# Patient Record
Sex: Female | Born: 1970 | Race: White | Hispanic: No | Marital: Married | Smoking: Never smoker
Health system: Southern US, Community
[De-identification: ages and names within clinical notes are randomized; demographics above are authoritative.]

## PROBLEM LIST (undated history)

## (undated) DIAGNOSIS — M1990 Unspecified osteoarthritis, unspecified site: Secondary | ICD-10-CM

## (undated) DIAGNOSIS — D259 Leiomyoma of uterus, unspecified: Secondary | ICD-10-CM

## (undated) DIAGNOSIS — Z8489 Family history of other specified conditions: Secondary | ICD-10-CM

## (undated) DIAGNOSIS — F419 Anxiety disorder, unspecified: Secondary | ICD-10-CM

## (undated) DIAGNOSIS — R600 Localized edema: Secondary | ICD-10-CM

## (undated) HISTORY — PX: MEDIAL COLLATERAL LIGAMENT AND LATERAL COLLATERAL LIGAMENT REPAIR, ELBOW: SHX2016

## (undated) HISTORY — PX: TONSILLECTOMY: SUR1361

## (undated) HISTORY — PX: BREAST SURGERY: SHX581

## (undated) HISTORY — PX: EYE SURGERY: SHX253

## (undated) HISTORY — PX: BREAST ENHANCEMENT SURGERY: SHX7

## (undated) HISTORY — PX: ABDOMINOPLASTY: SUR9

## (undated) HISTORY — PX: FRACTURE SURGERY: SHX138

## (undated) HISTORY — PX: ANTERIOR CRUCIATE LIGAMENT REPAIR: SHX115

## (undated) HISTORY — PX: COSMETIC SURGERY: SHX468

---

## 2011-04-26 HISTORY — PX: ANTERIOR CRUCIATE LIGAMENT REPAIR: SHX115

## 2012-02-07 DIAGNOSIS — S83289A Other tear of lateral meniscus, current injury, unspecified knee, initial encounter: Secondary | ICD-10-CM | POA: Insufficient documentation

## 2012-02-07 HISTORY — DX: Other tear of lateral meniscus, current injury, unspecified knee, initial encounter: S83.289A

## 2017-07-31 DIAGNOSIS — H532 Diplopia: Secondary | ICD-10-CM | POA: Insufficient documentation

## 2017-07-31 DIAGNOSIS — R29891 Ocular torticollis: Secondary | ICD-10-CM | POA: Insufficient documentation

## 2017-07-31 DIAGNOSIS — H4910 Fourth [trochlear] nerve palsy, unspecified eye: Secondary | ICD-10-CM | POA: Insufficient documentation

## 2017-07-31 DIAGNOSIS — H5022 Vertical strabismus, left eye: Secondary | ICD-10-CM

## 2017-07-31 DIAGNOSIS — H5021 Vertical strabismus, right eye: Secondary | ICD-10-CM | POA: Insufficient documentation

## 2017-07-31 DIAGNOSIS — H519 Unspecified disorder of binocular movement: Secondary | ICD-10-CM

## 2017-07-31 HISTORY — DX: Vertical strabismus, left eye: H50.22

## 2017-07-31 HISTORY — DX: Unspecified disorder of binocular movement: H51.9

## 2017-10-19 DIAGNOSIS — Z9889 Other specified postprocedural states: Secondary | ICD-10-CM | POA: Insufficient documentation

## 2017-10-19 HISTORY — DX: Other specified postprocedural states: Z98.890

## 2018-03-13 LAB — HM MAMMOGRAPHY

## 2019-12-23 LAB — RESULTS CONSOLE HPV: CHL HPV: NEGATIVE

## 2019-12-23 LAB — HM PAP SMEAR
HM Pap smear: NEGATIVE
HPV, high-risk: NEGATIVE

## 2020-02-06 ENCOUNTER — Ambulatory Visit (INDEPENDENT_AMBULATORY_CARE_PROVIDER_SITE_OTHER): Payer: Self-pay | Admitting: Family Medicine

## 2020-02-20 ENCOUNTER — Ambulatory Visit (INDEPENDENT_AMBULATORY_CARE_PROVIDER_SITE_OTHER): Payer: Self-pay | Admitting: Family Medicine

## 2020-09-22 ENCOUNTER — Ambulatory Visit
Admission: RE | Admit: 2020-09-22 | Discharge: 2020-09-22 | Disposition: A | Discharge status: Home / Self Care | Payer: Federal, State, Local not specified - PPO | Source: Ambulatory Visit | Attending: Family Medicine | Admitting: Family Medicine

## 2020-09-22 ENCOUNTER — Other Ambulatory Visit: Payer: Self-pay

## 2020-09-22 VITALS — BP 134/88 | HR 96 | Temp 98.3°F | Resp 18 | Ht 63.0 in | Wt 250.0 lb

## 2020-09-22 DIAGNOSIS — R197 Diarrhea, unspecified: Secondary | ICD-10-CM | POA: Insufficient documentation

## 2020-09-22 HISTORY — DX: Anxiety disorder, unspecified: F41.9

## 2020-09-22 MED ORDER — DICYCLOMINE HCL 20 MG PO TABS: 20.0000 mg | ORAL_TABLET | Freq: Two times a day (BID) | ORAL | 0 refills | Status: DC

## 2020-09-22 MED ORDER — DIPHENOXYLATE-ATROPINE 2.5-0.025 MG PO TABS: 1.0000 | ORAL_TABLET | Freq: Four times a day (QID) | ORAL | 0 refills | Status: DC | PRN

## 2020-09-22 NOTE — ED Provider Notes (Signed)
[facility]    CSN: [id number] Arrival date & time: 09/22/20  1445      History   Chief Complaint Chief Complaint  Patient presents with  . Diarrhea    HPI Kimberly Good is a 50 y.o. female.   Reports that she has been having watery diarrhea, at least 5-6 episodes a day for the last 6 days. Has been taking imodium without relief. Reports that she has recently been camping but that she only drinks bottled water. Also reports that she has recently been on two separate antibiotics for a sinus infection and a UTI. Denies previous symptoms. Denies headache, cough, nausea, vomiting, rash, fever, other symptoms.  ROS per HPI  The history is provided by the patient.    Past Medical History:  Diagnosis Date  . Anxiety     There are no problems to display for this patient.   Past Surgical History:  Procedure Laterality Date  . ANTERIOR CRUCIATE LIGAMENT REPAIR    . BREAST ENHANCEMENT SURGERY    . COSMETIC SURGERY     arm lift   . MEDIAL COLLATERAL LIGAMENT AND LATERAL COLLATERAL LIGAMENT REPAIR, ELBOW    . TONSILLECTOMY      OB History   No obstetric history on file.      Home Medications    Prior to Admission medications   Medication Sig Start Date End Date Taking? Authorizing Provider  dicyclomine (BENTYL) 20 MG tablet Take 1 tablet (20 mg total) by mouth 2 (two) times daily. 09/22/20  Yes Moshe Cipro, NP  diphenoxylate-atropine (LOMOTIL) 2.5-0.025 MG tablet Take 1 tablet by mouth 4 (four) times daily as needed for diarrhea or loose stools. 09/22/20  Yes Moshe Cipro, NP  ciprofloxacin (CIPRO) 500 MG tablet Take 1 tablet (500 mg total) by mouth 2 (two) times daily. 09/23/20   Moshe Cipro, NP    Family History No family history on file.  Social History Social History   Tobacco Use  . Smoking status: Never Smoker  . Smokeless tobacco: Never Used  Substance Use Topics  . Alcohol use: Never  . Drug use: Never      Allergies   Patient has no known allergies.   Review of Systems Review of Systems   Physical Exam Triage Vital Signs ED Triage Vitals  Enc Vitals Group     BP 09/22/20 1532 134/88     Pulse Rate 09/22/20 1532 96     Resp 09/22/20 1532 18     Temp 09/22/20 1532 98.3 F (36.8 C)     Temp Source 09/22/20 1532 Oral     SpO2 09/22/20 1532 97 %     Weight --      Height --      Head Circumference --      Peak Flow --      Pain Score 09/22/20 1541 0     Pain Loc --      Pain Edu? --      Excl. in GC? --    No data found.  Updated Vital Signs BP 134/88   Pulse 96   Temp 98.3 F (36.8 C) (Oral)   Resp 18   Ht 5\' 3"  (1.6 m)   Wt 250 lb (113.4 kg)   LMP 06/17/2020   SpO2 97%   BMI 44.29 kg/m   Visual Acuity Right Eye Distance:   Left Eye Distance:   Bilateral Distance:    Right Eye Near:   Left Eye Near:  Bilateral Near:     Physical Exam Vitals and nursing note reviewed.  Constitutional:      General: She is not in acute distress.    Appearance: Normal appearance. She is well-developed. She is ill-appearing.  HENT:     Head: Normocephalic and atraumatic.     Nose: Nose normal.     Mouth/Throat:     Mouth: Mucous membranes are moist.     Pharynx: Oropharynx is clear.  Eyes:     Extraocular Movements: Extraocular movements intact.     Conjunctiva/sclera: Conjunctivae normal.     Pupils: Pupils are equal, round, and reactive to light.  Cardiovascular:     Rate and Rhythm: Normal rate and regular rhythm.     Heart sounds: Normal heart sounds. No murmur heard.   Pulmonary:     Effort: Pulmonary effort is normal. No respiratory distress.     Breath sounds: Normal breath sounds. No stridor. No wheezing, rhonchi or rales.  Abdominal:     General: There is no distension.     Palpations: Abdomen is soft. There is no mass.     Tenderness: There is abdominal tenderness (generalized). There is no right CVA tenderness, left CVA tenderness, guarding or  rebound.     Hernia: No hernia is present.     Comments: Hyperactive bowel sounds  Musculoskeletal:        General: Normal range of motion.     Cervical back: Normal range of motion and neck supple.  Skin:    General: Skin is warm and dry.     Capillary Refill: Capillary refill takes less than 2 seconds.  Neurological:     General: No focal deficit present.     Mental Status: She is alert and oriented to person, place, and time.  Psychiatric:        Mood and Affect: Mood normal.        Behavior: Behavior normal.        Thought Content: Thought content normal.      UC Treatments / Results  Labs (all labs ordered are listed, but only abnormal results are displayed) Labs Reviewed  GASTROINTESTINAL PANEL BY PCR, STOOL (REPLACES STOOL CULTURE) - Abnormal; Notable for the following components:      Result Value   Enteropathogenic E coli (EPEC) DETECTED (*)    All other components within normal limits  C DIFFICILE QUICK SCREEN W PCR REFLEX - Abnormal; Notable for the following components:   C Diff antigen POSITIVE (*)    All other components within normal limits  CLOSTRIDIUM DIFFICILE BY PCR, REFLEXED    EKG   Radiology No results found.  Procedures Procedures (including critical care time)  Medications Ordered in UC Medications - No data to display  Initial Impression / Assessment and Plan / UC Course  I have reviewed the triage vital signs and the nursing notes.  Pertinent labs & imaging results that were available during my care of the patient were reviewed by me and considered in my medical decision making (see chart for details).    Presumed infectious diarrhea  Prescribed Lomotil prn diarrhea Prescribed dicyclomine for abdominal cramping Stool collected in office for GI pathogen panel and Cdiff testing Push fluids and get plenty of rest We will follow up with abnormal results that require further treatment Follow up with this office or with primary care if  symptoms are persisting.  Follow up in the ER for high fever, trouble swallowing, trouble breathing, other concerning symptoms.  Final Clinical Impressions(s) / UC Diagnoses   Final diagnoses:  Diarrhea of presumed infectious origin     Discharge Instructions     GI Pathogen panel ordered  Suspect C diff due to recent antibiotic use  I have sent in lomotil for you to take every 6 hours   I have sent in dicyclomine for you to take twice a day as needed for abdominal cramping  Follow up with the ER if you stop urinating, fever, increased fatigue, other concerning symptoms     ED Prescriptions    Medication Sig Dispense Auth. Provider   diphenoxylate-atropine (LOMOTIL) 2.5-0.025 MG tablet Take 1 tablet by mouth 4 (four) times daily as needed for diarrhea or loose stools. 30 tablet Moshe Cipro, NP   dicyclomine (BENTYL) 20 MG tablet Take 1 tablet (20 mg total) by mouth 2 (two) times daily. 20 tablet Moshe Cipro, NP     PDMP not reviewed this encounter.   Moshe Cipro, NP 09/25/20 1649

## 2020-09-22 NOTE — Discharge Instructions (Addendum)
GI Pathogen panel ordered  Suspect C diff due to recent antibiotic use  I have sent in lomotil for you to take every 6 hours   I have sent in dicyclomine for you to take twice a day as needed for abdominal cramping  Follow up with the ER if you stop urinating, fever, increased fatigue, other concerning symptoms

## 2020-09-22 NOTE — ED Triage Notes (Signed)
Diarrhea x 6 days.  Was told by pcp to come to urgent care for stool study.  Pt has been on 2 abx in the past month.

## 2020-09-23 ENCOUNTER — Telehealth: Payer: Self-pay | Admitting: Family Medicine

## 2020-09-23 DIAGNOSIS — A040 Enteropathogenic Escherichia coli infection: Secondary | ICD-10-CM

## 2020-09-23 LAB — GASTROINTESTINAL PANEL BY PCR, STOOL (REPLACES STOOL CULTURE)

## 2020-09-23 LAB — C DIFFICILE QUICK SCREEN W PCR REFLEX
C Diff antigen: POSITIVE — AB
C Diff toxin: NEGATIVE

## 2020-09-23 MED ORDER — CIPROFLOXACIN HCL 500 MG PO TABS: 500.0000 mg | ORAL_TABLET | Freq: Two times a day (BID) | ORAL | 0 refills | Status: DC

## 2020-09-23 NOTE — Telephone Encounter (Signed)
Sent in Cipro 500mg  BID x 7 days for enteropathogenic E Coli on stool pcr.

## 2020-09-26 ENCOUNTER — Encounter (HOSPITAL_COMMUNITY): Payer: Self-pay | Admitting: Emergency Medicine

## 2020-09-26 ENCOUNTER — Emergency Department (HOSPITAL_COMMUNITY)
Admission: EM | Admit: 2020-09-26 | Discharge: 2020-09-27 | Disposition: A | Discharge status: Home / Self Care | Payer: Federal, State, Local not specified - PPO | Attending: Emergency Medicine | Admitting: Emergency Medicine

## 2020-09-26 ENCOUNTER — Emergency Department (HOSPITAL_COMMUNITY): Payer: Federal, State, Local not specified - PPO

## 2020-09-26 ENCOUNTER — Other Ambulatory Visit: Payer: Self-pay

## 2020-09-26 DIAGNOSIS — M79643 Pain in unspecified hand: Secondary | ICD-10-CM | POA: Insufficient documentation

## 2020-09-26 DIAGNOSIS — R079 Chest pain, unspecified: Secondary | ICD-10-CM | POA: Insufficient documentation

## 2020-09-26 DIAGNOSIS — T360X5A Adverse effect of penicillins, initial encounter: Secondary | ICD-10-CM | POA: Insufficient documentation

## 2020-09-26 DIAGNOSIS — R197 Diarrhea, unspecified: Secondary | ICD-10-CM | POA: Diagnosis not present

## 2020-09-26 DIAGNOSIS — X58XXXA Exposure to other specified factors, initial encounter: Secondary | ICD-10-CM | POA: Insufficient documentation

## 2020-09-26 DIAGNOSIS — T887XXA Unspecified adverse effect of drug or medicament, initial encounter: Secondary | ICD-10-CM

## 2020-09-26 LAB — CBC
HCT: 38.2 % (ref 36.0–46.0)
Hemoglobin: 12.9 g/dL (ref 12.0–15.0)
MCH: 28.1 pg (ref 26.0–34.0)
MCHC: 33.8 g/dL (ref 30.0–36.0)
MCV: 83.2 fL (ref 80.0–100.0)
Platelets: 286 10*3/uL (ref 150–400)
RBC: 4.59 MIL/uL (ref 3.87–5.11)
RDW: 14.7 % (ref 11.5–15.5)
WBC: 6.7 10*3/uL (ref 4.0–10.5)
nRBC: 0 % (ref 0.0–0.2)

## 2020-09-26 LAB — BASIC METABOLIC PANEL
Anion gap: 7 (ref 5–15)
BUN: 7 mg/dL (ref 6–20)
CO2: 24 mmol/L (ref 22–32)
Calcium: 8.6 mg/dL — ABNORMAL LOW (ref 8.9–10.3)
Chloride: 103 mmol/L (ref 98–111)
Creatinine, Ser: 0.7 mg/dL (ref 0.44–1.00)
GFR, Estimated: >60 mL/min (ref >60)
Glucose, Bld: 88 mg/dL (ref 70–99)
Potassium: 3.7 mmol/L (ref 3.5–5.1)
Sodium: 134 mmol/L — ABNORMAL LOW (ref 135–145)

## 2020-09-26 LAB — POC URINE PREG, ED: Preg Test, Ur: NEGATIVE

## 2020-09-26 LAB — TROPONIN I (HIGH SENSITIVITY): Troponin I (High Sensitivity): 3 ng/L (ref <18)

## 2020-09-26 MED ORDER — LACTATED RINGERS IV BOLUS
1000.0000 mL | Freq: Once | INTRAVENOUS | Status: AC
  Administered 2020-09-27: 1000 mL via INTRAVENOUS

## 2020-09-26 NOTE — ED Notes (Signed)
Patient transported to X-ray 

## 2020-09-26 NOTE — ED Triage Notes (Signed)
Pt c/o chest tightness since this afternoon. Pt concerned it is related to her starting a new abx.

## 2020-09-27 LAB — CK: Total CK: 28 U/L — ABNORMAL LOW (ref 38–234)

## 2020-09-27 LAB — URINALYSIS, ROUTINE W REFLEX MICROSCOPIC
Bilirubin Urine: NEGATIVE
Glucose, UA: NEGATIVE mg/dL
Ketones, ur: NEGATIVE mg/dL
Leukocytes,Ua: NEGATIVE
Nitrite: NEGATIVE
Protein, ur: NEGATIVE mg/dL
Specific Gravity, Urine: 1.003 — ABNORMAL LOW (ref 1.005–1.030)
pH: 6 (ref 5.0–8.0)

## 2020-09-27 LAB — TROPONIN I (HIGH SENSITIVITY): Troponin I (High Sensitivity): 3 ng/L (ref <18)

## 2020-09-27 MED ORDER — AZITHROMYCIN 1 G PO PACK
1.0000 g | PACK | Freq: Once | ORAL | Status: AC
  Administered 2020-09-27: 1 g via ORAL
  Filled 2020-09-27: qty 1

## 2020-09-27 NOTE — ED Provider Notes (Signed)
[facility] EMERGENCY DEPARTMENT Provider Note   CSN: [id number] Arrival date & time: 09/26/20  2132     History Chief Complaint  Patient presents with  . Chest Pain    Danila Eddie is a 50 y.o. female.  HPI  Here with chest pain.  Sounds like patient recently had an episode of sinus infection associated with a skin wound concern for infection got started on amoxicillin.  Shortly afterwards she started having UTI which she started nitrofurantoin and that improved after 3 to 5 days.  Then she started having nonbloody nonbilious diarrhea.  She was diagnosed with enterotoxigenic E. coli and questionable C. difficile.  She was started on Cipro for E. coli but then was told that the C. difficile was negative. The PCR is still in process.  She started the Cipro a few days ago and had 6 total doses so far.  She states that this morning she woke up feeling lethargic.  She states that she felt she was sore all over and all of her muscles.  She also had pretty significant hand pain.  She states that the pain in her hands seem like it was her whole hand and not just in the musculature.  She also had pain in Mykacet multiple other muscles.  Urinating okay now.  Her stools are starting to slow down form up but still having diarrhea.  No fevers today.  No other symptoms.  She states that she does have joint pain all over as well.     Past Medical History:  Diagnosis Date  . Anxiety     There are no problems to display for this patient.   Past Surgical History:  Procedure Laterality Date  . ANTERIOR CRUCIATE LIGAMENT REPAIR    . BREAST ENHANCEMENT SURGERY    . COSMETIC SURGERY     arm lift   . MEDIAL COLLATERAL LIGAMENT AND LATERAL COLLATERAL LIGAMENT REPAIR, ELBOW    . TONSILLECTOMY       OB History   No obstetric history on file.     History reviewed. No pertinent family history.  Social History   Tobacco Use  . Smoking status: Never Smoker  . Smokeless tobacco: Never Used   Substance Use Topics  . Alcohol use: Never  . Drug use: Never    Home Medications Prior to Admission medications   Medication Sig Start Date End Date Taking? Authorizing Provider  ciprofloxacin (CIPRO) 500 MG tablet Take 1 tablet (500 mg total) by mouth 2 (two) times daily. 09/23/20   Moshe Cipro, NP  dicyclomine (BENTYL) 20 MG tablet Take 1 tablet (20 mg total) by mouth 2 (two) times daily. 09/22/20   Moshe Cipro, NP  diphenoxylate-atropine (LOMOTIL) 2.5-0.025 MG tablet Take 1 tablet by mouth 4 (four) times daily as needed for diarrhea or loose stools. 09/22/20   Moshe Cipro, NP    Allergies    Patient has no known allergies.  Review of Systems   Review of Systems  All other systems reviewed and are negative.   Physical Exam Updated Vital Signs BP 137/78   Pulse 80   Temp 98.2 F (36.8 C)   Resp 17   Ht 5\' 3"  (1.6 m)   Wt 116.6 kg   SpO2 100%   BMI 45.53 kg/m   Physical Exam Vitals and nursing note reviewed.  Constitutional:      Appearance: She is well-developed.  HENT:     Head: Normocephalic and atraumatic.     Nose: No  rhinorrhea.     Mouth/Throat:     Mouth: Mucous membranes are moist.  Eyes:     Pupils: Pupils are equal, round, and reactive to light.  Cardiovascular:     Rate and Rhythm: Normal rate and regular rhythm.  Pulmonary:     Effort: No respiratory distress.     Breath sounds: No stridor. No decreased breath sounds.  Chest:     Chest wall: No mass or tenderness.  Abdominal:     General: There is no distension.  Musculoskeletal:     Cervical back: Normal range of motion.     Right lower leg: No tenderness. No edema.     Left lower leg: No tenderness. No edema.  Skin:    General: Skin is warm and dry.  Neurological:     General: No focal deficit present.     Mental Status: She is alert.     ED Results / Procedures / Treatments   Labs (all labs ordered are listed, but only abnormal results are displayed) Labs  Reviewed  BASIC METABOLIC PANEL - Abnormal; Notable for the following components:      Result Value   Sodium 134 (*)    Calcium 8.6 (*)    All other components within normal limits  CK - Abnormal; Notable for the following components:   Total CK 28 (*)    All other components within normal limits  URINALYSIS, ROUTINE W REFLEX MICROSCOPIC - Abnormal; Notable for the following components:   Color, Urine STRAW (*)    Specific Gravity, Urine 1.003 (*)    Hgb urine dipstick SMALL (*)    Bacteria, UA RARE (*)    All other components within normal limits  CBC  POC URINE PREG, ED  TROPONIN I (HIGH SENSITIVITY)  TROPONIN I (HIGH SENSITIVITY)    EKG EKG Interpretation  Date/Time:  Saturday September 26 2020 22:08:26 EDT Ventricular Rate:  95 PR Interval:  168 QRS Duration: 92 QT Interval:  342 QTC Calculation: 429 R Axis:   8 Text Interpretation: Normal sinus rhythm Incomplete right bundle branch block Inferior infarct , age undetermined Cannot rule out Anterior infarct , age undetermined Abnormal ECG Confirmed by Marily Memos [phone]) on 09/26/2020 10:59:25 PM   Radiology DG Chest 2 View  Result Date: 09/26/2020 CLINICAL DATA:  Chest pain EXAM: CHEST - 2 VIEW COMPARISON:  None. FINDINGS: The heart size and mediastinal contours are within normal limits. Both lungs are clear. The visualized skeletal structures are unremarkable. IMPRESSION: No active cardiopulmonary disease. Electronically Signed   By: Deatra Robinson M.D.   On: 09/26/2020 22:48    Procedures Procedures   Medications Ordered in ED Medications  lactated ringers bolus 1,000 mL (0 mLs Intravenous Stopped 09/27/20 0201)  azithromycin (ZITHROMAX) powder 1 g (1 g Oral Given 09/27/20 0201)    ED Course  I have reviewed the triage vital signs and the nursing notes.  Pertinent labs & imaging results that were available during my care of the patient were reviewed by me and considered in my medical decision making (see chart for  details).    MDM Rules/Calculators/A&P                         Very well could be side effect of cipro. Also advised against lomotil in this type of diarrhea. Will eval for rhabdo.  Workup reassuring. Plan for azithro for the EPEC, dc cipro. Pending pcr for c diff. No rhabdo. No  cardiac cause.   Final Clinical Impression(s) / ED Diagnoses Final diagnoses:  Nonspecific chest pain  Medication side effect    Rx / DC Orders ED Discharge Orders    None       Nicklos Gaxiola, Barbara Cower, MD 09/27/20 [phone]

## 2020-11-17 ENCOUNTER — Other Ambulatory Visit (HOSPITAL_COMMUNITY): Payer: Self-pay | Admitting: Sports Medicine

## 2020-11-17 ENCOUNTER — Other Ambulatory Visit (HOSPITAL_COMMUNITY): Payer: Self-pay | Admitting: *Deleted

## 2020-11-17 DIAGNOSIS — M25572 Pain in left ankle and joints of left foot: Secondary | ICD-10-CM

## 2020-11-18 ENCOUNTER — Ambulatory Visit (HOSPITAL_COMMUNITY)
Admission: RE | Admit: 2020-11-18 | Discharge: 2020-11-18 | Disposition: A | Discharge status: Home / Self Care | Payer: Federal, State, Local not specified - PPO | Source: Ambulatory Visit | Attending: Sports Medicine | Admitting: Sports Medicine

## 2020-11-18 ENCOUNTER — Other Ambulatory Visit: Payer: Self-pay

## 2020-11-18 DIAGNOSIS — M25572 Pain in left ankle and joints of left foot: Secondary | ICD-10-CM | POA: Diagnosis present

## 2021-01-01 LAB — HM MAMMOGRAPHY

## 2021-06-30 DIAGNOSIS — H6502 Acute serous otitis media, left ear: Secondary | ICD-10-CM | POA: Diagnosis not present

## 2021-07-27 IMAGING — MR MR ANKLE*L* W/O CM
5 series · 38 of 40 positions shown · non-contrast
Comparison: None.

CLINICAL DATA: Left ankle pain, no known injury.

EXAM:
MRI OF THE LEFT ANKLE WITHOUT CONTRAST
TECHNIQUE: Multiplanar, multisequence MR imaging of the ankle was performed. No
intravenous contrast was administered.

[Series 2: T2 fat-sat · axial · left · 4.0mm · 0.42mm/px · z∈[-94,+71]mm · 9 of 34 slices shown (1 of 2)]
[im 1/34]
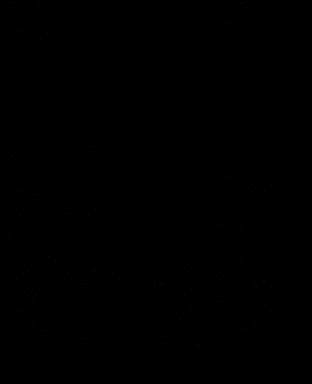
[im 5/34]
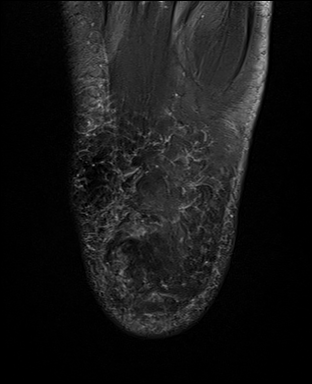
[im 9/34]
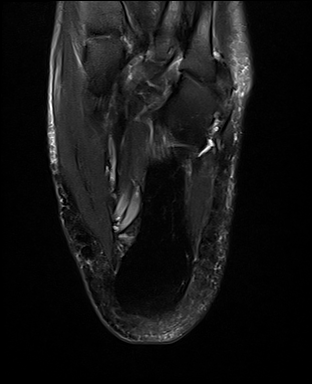
[im 13/34]
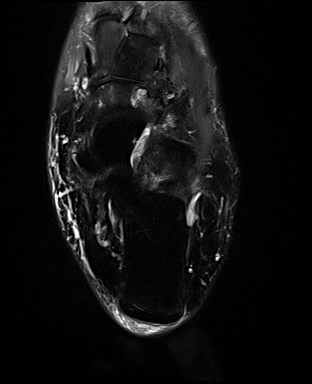
[im 17/34]
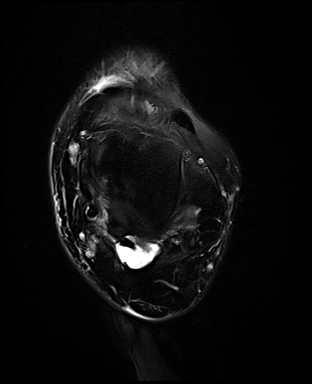
[im 21/34]
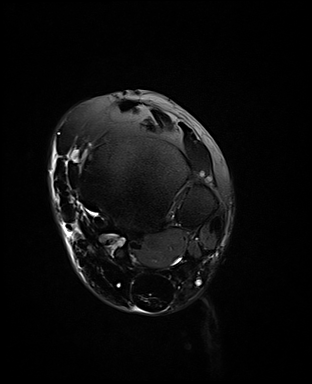
[im 25/34]
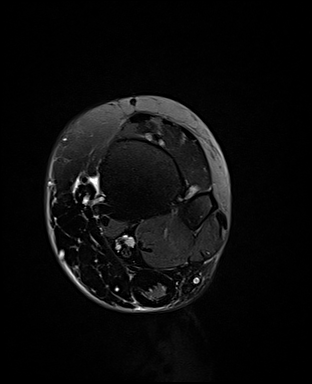
[im 29/34]
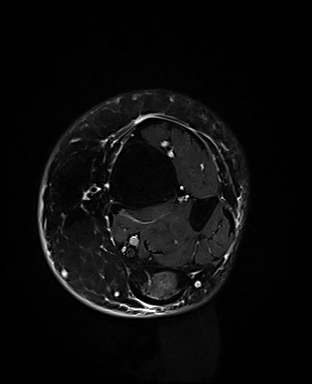
[im 34/34]
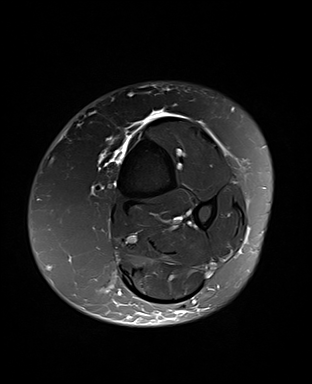

[Series 3: PD fat-sat · axial · left · 4.0mm · 0.50mm/px · z∈[-94,+71]mm · 8 of 34 slices shown]
[im 1/34]
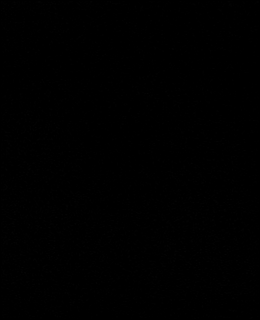
[im 5/34]
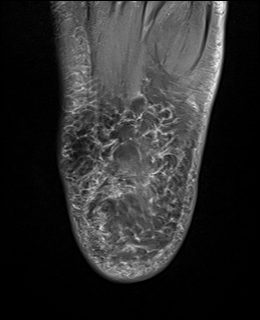
[im 10/34]
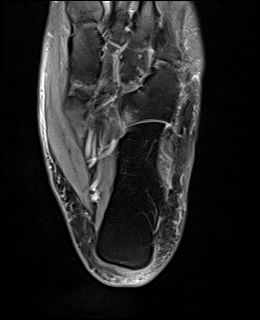
[im 15/34]
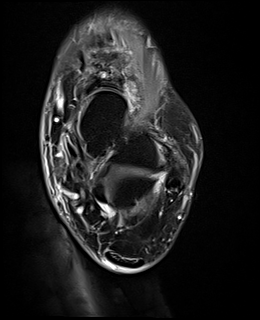
[im 19/34]
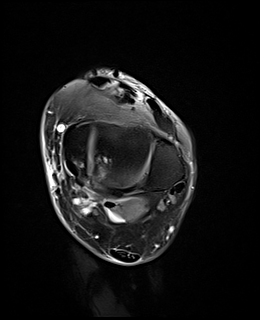
[im 24/34]
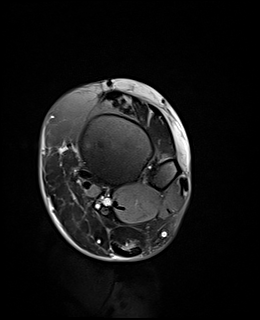
[im 29/34]
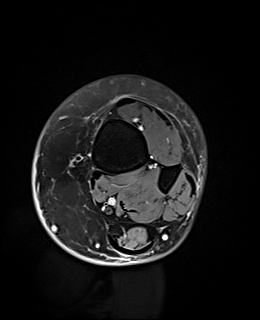
[im 34/34]
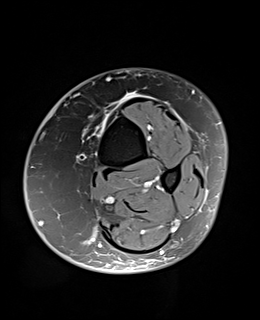

[Series 4: STIR · sagittal · left · 3.0mm · 0.31mm/px · 5 of 28 slices shown]
[im 1/28]
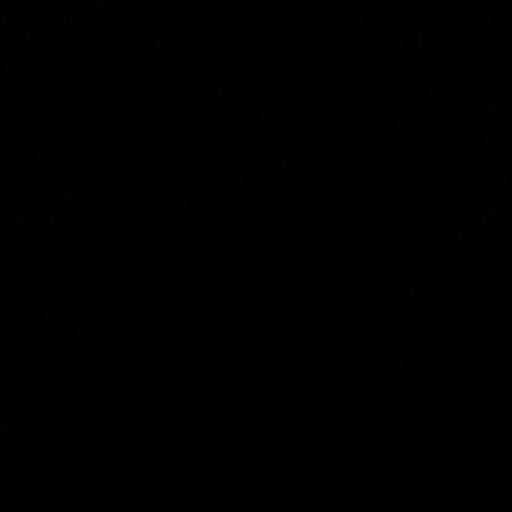
[im 5/28]
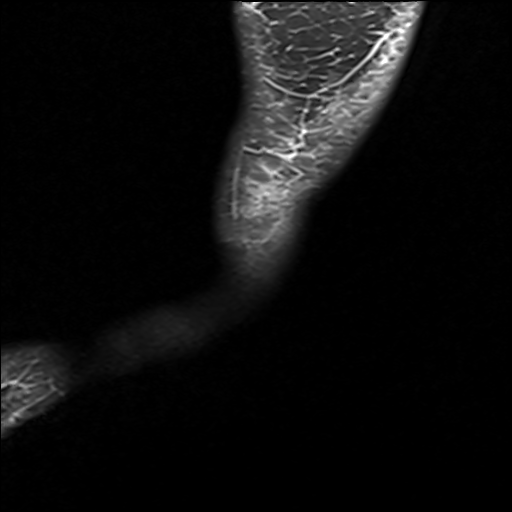
[im 10/28]
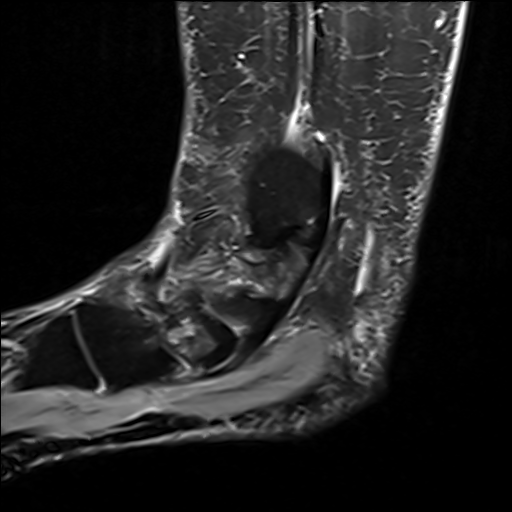
[im 14/28]
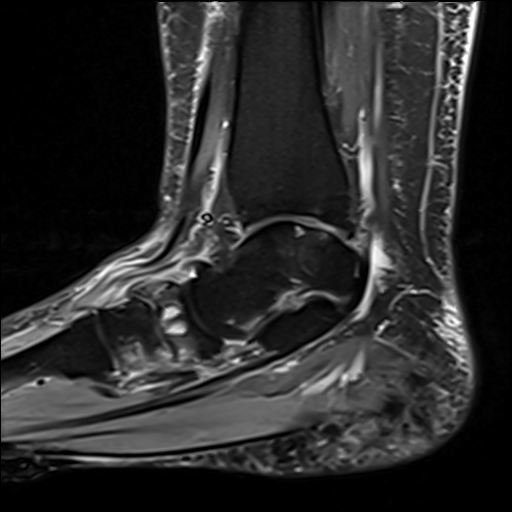
[im 19/28]
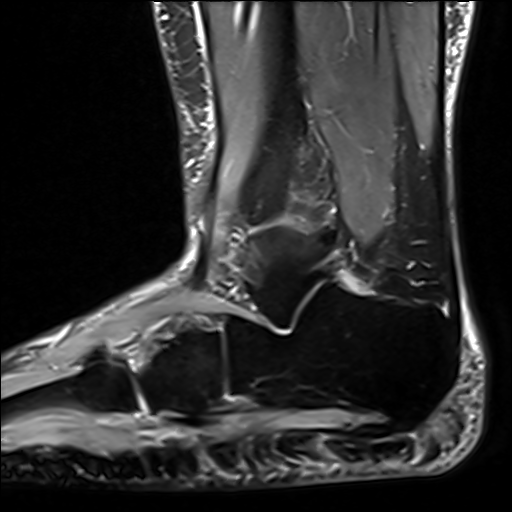

[Series 5: T2 fat-sat · coronal · left · 3.0mm · 0.50mm/px · 9 of 37 slices shown (2 of 2)]
[im 1/37]
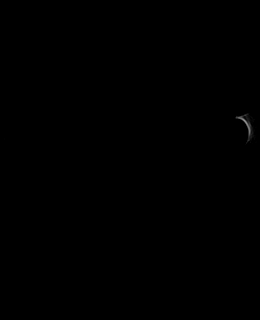
[im 5/37]
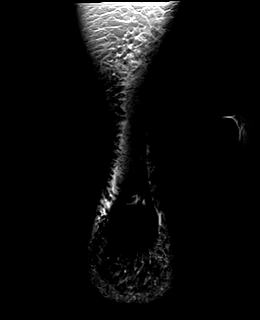
[im 10/37]
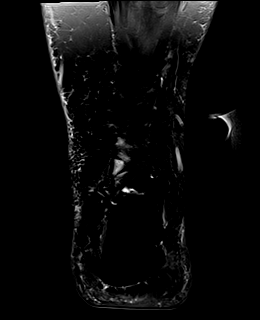
[im 14/37]
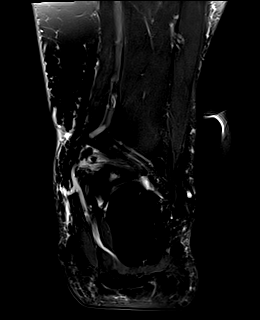
[im 19/37]
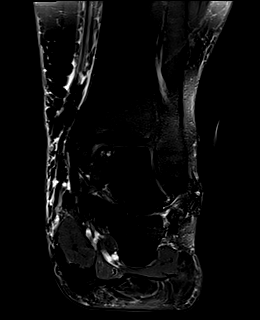
[im 23/37]
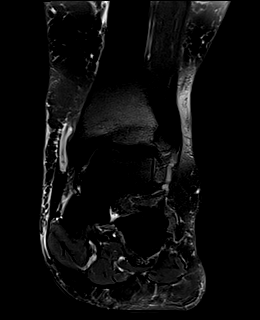
[im 28/37]
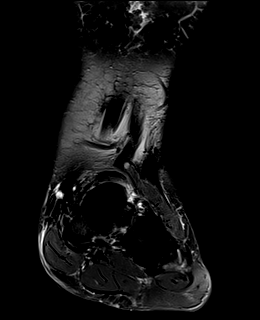
[im 32/37]
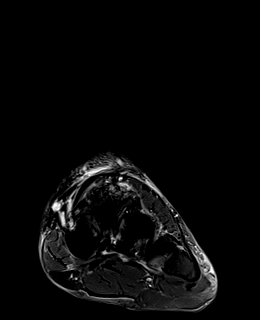
[im 37/37]
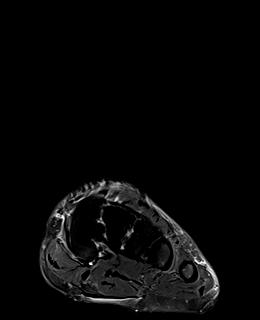

[Series 6: T1 · sagittal · left · 3.0mm · 0.50mm/px · 7 of 30 slices shown]
[im 1/30]
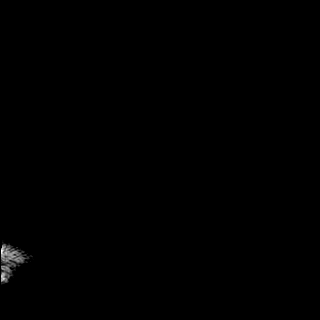
[im 5/30]
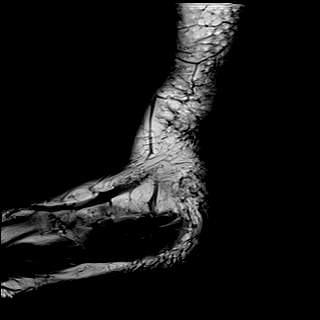
[im 10/30]
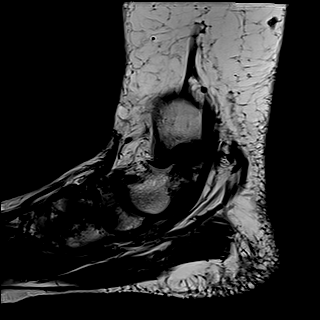
[im 15/30]
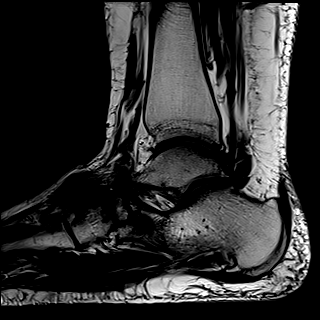
[im 20/30]
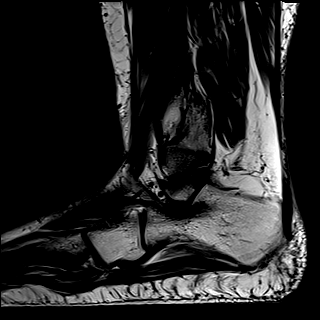
[im 25/30]
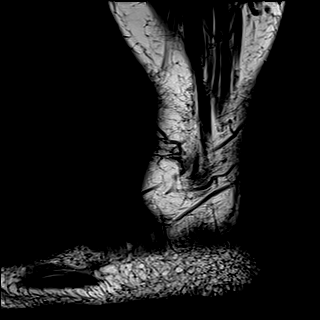
[im 30/30]
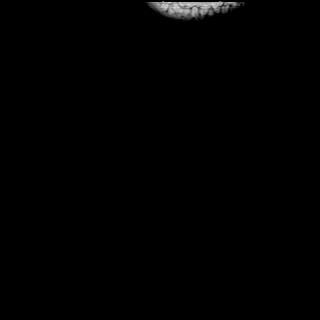

[38 of 40 positions shown; findings below may reference images not displayed]

FINDINGS: TENDONS

Peroneal: Peroneal longus tendon intact. Peroneal brevis intact.

Posteromedial: Posterior tibial tendon intact. Flexor hallucis
longus tendon intact. Flexor digitorum longus tendon intact.

Anterior: Tibialis anterior tendon intact. Extensor hallucis longus
tendon intact Extensor digitorum longus tendon intact.

Achilles:  Intact.

Plantar Fascia: Intact.

LIGAMENTS

Lateral: Anterior talofibular ligament intact. Calcaneofibular
ligament intact. Posterior talofibular ligament intact. Anterior and
posterior tibiofibular ligaments intact.

Medial: Deltoid ligament intact. Spring ligament intact.

CARTILAGE

Ankle Joint: No joint effusion. 14 x 7 mm osteochondral lesion
involving the medial corner of the talar dome with overlying
partial-thickness cartilage loss and subchondral reactive marrow
changes.

Subtalar Joints/Sinus Tarsi: Normal subtalar joints. No subtalar
joint effusion. Normal sinus tarsi.

Bones: No marrow signal abnormality. No fracture or dislocation.
Small plantar calcaneal spur. Mild subchondral reactive marrow edema
along the proximal medial aspect of the cuboid at the calcaneocuboid
articulation. Moderate osteoarthritis of the navicular-cuneiform
joint with subchondral reactive marrow edema and cystic changes in
the navicular.

Soft Tissue: No fluid collection or hematoma. Muscles are normal
without edema or atrophy. Tarsal tunnel is normal.
IMPRESSION: 1. A 14 x 7 mm osteochondral lesion involving the medial corner of
the talar dome with overlying partial-thickness cartilage loss and
subchondral reactive marrow changes.
2. Moderate osteoarthritis of the navicular-cuneiform joint with
subchondral reactive marrow edema and cystic changes in the
navicular.
3. Mild subchondral reactive marrow edema along the proximal medial
aspect of the cuboid at the calcaneocuboid articulation.

## 2021-12-01 ENCOUNTER — Encounter (INDEPENDENT_AMBULATORY_CARE_PROVIDER_SITE_OTHER): Payer: Self-pay

## 2022-01-28 DIAGNOSIS — M25572 Pain in left ankle and joints of left foot: Secondary | ICD-10-CM | POA: Diagnosis not present

## 2022-01-28 DIAGNOSIS — M19072 Primary osteoarthritis, left ankle and foot: Secondary | ICD-10-CM | POA: Diagnosis not present

## 2022-01-28 DIAGNOSIS — M79672 Pain in left foot: Secondary | ICD-10-CM | POA: Diagnosis not present

## 2022-03-12 DIAGNOSIS — Z1152 Encounter for screening for COVID-19: Secondary | ICD-10-CM | POA: Diagnosis not present

## 2022-03-12 DIAGNOSIS — T3695XA Adverse effect of unspecified systemic antibiotic, initial encounter: Secondary | ICD-10-CM | POA: Diagnosis not present

## 2022-03-12 DIAGNOSIS — B379 Candidiasis, unspecified: Secondary | ICD-10-CM | POA: Diagnosis not present

## 2022-03-12 DIAGNOSIS — R051 Acute cough: Secondary | ICD-10-CM | POA: Diagnosis not present

## 2022-03-12 DIAGNOSIS — Z03818 Encounter for observation for suspected exposure to other biological agents ruled out: Secondary | ICD-10-CM | POA: Diagnosis not present

## 2022-03-12 DIAGNOSIS — J0110 Acute frontal sinusitis, unspecified: Secondary | ICD-10-CM | POA: Diagnosis not present

## 2022-03-14 DIAGNOSIS — Z6839 Body mass index (BMI) 39.0-39.9, adult: Secondary | ICD-10-CM | POA: Diagnosis not present

## 2022-03-14 DIAGNOSIS — H6122 Impacted cerumen, left ear: Secondary | ICD-10-CM | POA: Diagnosis not present

## 2022-03-28 DIAGNOSIS — L821 Other seborrheic keratosis: Secondary | ICD-10-CM | POA: Diagnosis not present

## 2022-03-28 DIAGNOSIS — D225 Melanocytic nevi of trunk: Secondary | ICD-10-CM | POA: Diagnosis not present

## 2022-05-05 DIAGNOSIS — Z79899 Other long term (current) drug therapy: Secondary | ICD-10-CM | POA: Diagnosis not present

## 2022-05-05 DIAGNOSIS — G8918 Other acute postprocedural pain: Secondary | ICD-10-CM | POA: Diagnosis not present

## 2022-05-05 DIAGNOSIS — M19072 Primary osteoarthritis, left ankle and foot: Secondary | ICD-10-CM | POA: Diagnosis not present

## 2022-05-05 DIAGNOSIS — Z881 Allergy status to other antibiotic agents status: Secondary | ICD-10-CM | POA: Diagnosis not present

## 2022-05-05 DIAGNOSIS — Z888 Allergy status to other drugs, medicaments and biological substances status: Secondary | ICD-10-CM | POA: Diagnosis not present

## 2022-05-05 HISTORY — PX: FRACTURE SURGERY: SHX138

## 2022-06-02 DIAGNOSIS — M79672 Pain in left foot: Secondary | ICD-10-CM | POA: Diagnosis not present

## 2022-06-02 DIAGNOSIS — Z4889 Encounter for other specified surgical aftercare: Secondary | ICD-10-CM | POA: Diagnosis not present

## 2022-06-29 DIAGNOSIS — M79672 Pain in left foot: Secondary | ICD-10-CM | POA: Diagnosis not present

## 2022-07-27 DIAGNOSIS — M79672 Pain in left foot: Secondary | ICD-10-CM | POA: Diagnosis not present

## 2022-08-22 NOTE — Progress Notes (Unsigned)
New patient visit   Patient: Kimberly Good   DOB: 1970/12/24   51 y.o. Female  MRN: [id number] Visit Date: 08/23/2022  Today's healthcare provider: Ronnald Ramp, MD   No chief complaint on file.  Subjective    Kimberly Good is a 52 y.o. female who presents today as a new patient to establish care.  HPI    Encounter to Establish Care Patient presents to establish care  Introduced myself and my role as primary care physician  We reviewed patient's medical, surgical, and social history and medications as listed below    PMHX   Last annual physical: ***   *** Medications: ***   ***  Medications: ***    Social Hx  Tobacco use: *** Alcohol Use : *** Illicit drug use: ***  ***    Concerns for Today:   ***:   Past Medical History:  Diagnosis Date   Anxiety    Past Surgical History:  Procedure Laterality Date   ANTERIOR CRUCIATE LIGAMENT REPAIR     BREAST ENHANCEMENT SURGERY     COSMETIC SURGERY     arm lift    MEDIAL COLLATERAL LIGAMENT AND LATERAL COLLATERAL LIGAMENT REPAIR, ELBOW     TONSILLECTOMY     No family status information on file.   No family history on file. Social History   Socioeconomic History   Marital status: Married    Spouse name: Not on file   Number of children: Not on file   Years of education: Not on file   Highest education level: Not on file  Occupational History   Not on file  Tobacco Use   Smoking status: Never   Smokeless tobacco: Never  Substance and Sexual Activity   Alcohol use: Never   Drug use: Never   Sexual activity: Not on file  Other Topics Concern   Not on file  Social History Narrative   Not on file   Social Determinants of Health   Financial Resource Strain: Not on file  Food Insecurity: Not on file  Transportation Needs: Not on file  Physical Activity: Not on file  Stress: Not on file  Social Connections: Not on file   Outpatient Medications Prior to Visit  Medication Sig    ciprofloxacin (CIPRO) 500 MG tablet Take 1 tablet (500 mg total) by mouth 2 (two) times daily.   dicyclomine (BENTYL) 20 MG tablet Take 1 tablet (20 mg total) by mouth 2 (two) times daily.   diphenoxylate-atropine (LOMOTIL) 2.5-0.025 MG tablet Take 1 tablet by mouth 4 (four) times daily as needed for diarrhea or loose stools.   No facility-administered medications prior to visit.   No Known Allergies   There is no immunization history on file for this patient.  Health Maintenance  Topic Date Due   COVID-19 Vaccine (1) Never done   HIV Screening  Never done   Hepatitis C Screening  Never done   DTaP/Tdap/Td (1 - Tdap) Never done   PAP SMEAR-Modifier  Never done   COLONOSCOPY (Pts 45-63yrs Insurance coverage will need to be confirmed)  Never done   MAMMOGRAM  Never done   Zoster Vaccines- Shingrix (1 of 2) Never done   INFLUENZA VACCINE  11/24/2022   HPV VACCINES  Aged Out    Patient Care Team: System, Provider Not In as PCP - General  Review of Systems  {Labs  Heme  Chem  Endocrine  Serology  Results Review (optional):23779}   Objective    There were  no vitals taken for this visit. {Show previous vital signs (optional):23777}  Physical Exam ***  Depression Screen     No data to display         No results found for any visits on 08/23/22.  Assessment & Plan     ***  No follow-ups on file.     {provider attestation***:1}   Ronnald Ramp, MD  [facility] [phone] (phone) [phone] (fax)  [facility]

## 2022-08-23 ENCOUNTER — Ambulatory Visit: Payer: Federal, State, Local not specified - PPO | Admitting: Family Medicine

## 2022-08-23 ENCOUNTER — Encounter: Payer: Self-pay | Admitting: Family Medicine

## 2022-08-23 VITALS — BP 118/61 | HR 80 | Temp 97.8°F | Resp 16 | Ht 63.0 in | Wt 250.0 lb

## 2022-08-23 DIAGNOSIS — Z1231 Encounter for screening mammogram for malignant neoplasm of breast: Secondary | ICD-10-CM

## 2022-08-23 DIAGNOSIS — R600 Localized edema: Secondary | ICD-10-CM | POA: Diagnosis not present

## 2022-08-23 DIAGNOSIS — Z1211 Encounter for screening for malignant neoplasm of colon: Secondary | ICD-10-CM

## 2022-08-23 DIAGNOSIS — M19072 Primary osteoarthritis, left ankle and foot: Secondary | ICD-10-CM

## 2022-08-23 DIAGNOSIS — Z7689 Persons encountering health services in other specified circumstances: Secondary | ICD-10-CM | POA: Diagnosis not present

## 2022-08-23 DIAGNOSIS — Z0000 Encounter for general adult medical examination without abnormal findings: Secondary | ICD-10-CM

## 2022-08-23 NOTE — Assessment & Plan Note (Signed)
Welcomed patient to [facility]  Reviewed patient's medical history, medications, surgical and social history Discussed roles and expectations for primary care physician-patient relationship Recommended patient schedule annual preventative examinations   

## 2022-08-23 NOTE — Patient Instructions (Addendum)
[facility] [address] [place],  [place]  [address] Main: [phone]   A referral has been placed on your behalf for both the lymphedema clinic and your colonoscopy.  Our referral coordination team or the office you will be visiting will contact you within the next 2 weeks. If you have not received a phone call within 10 business days please let us know so that we can check into this for you.   We will follow up with results of labs once they are available.    Please schedule a follow up visit with me in 2 months  Please check to confirm when you had your last physical and let us know so that we can schedule you for this year's physical.   I look forward to taking part in your care.   Best,   Dr. Roxan Hockey

## 2022-08-23 NOTE — Assessment & Plan Note (Signed)
Mammogram ordered today  Patient given card to schedule mammogram   Colon cancer screening GI referral for colonoscopy submitted

## 2022-08-23 NOTE — Assessment & Plan Note (Signed)
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

## 2022-08-23 NOTE — Assessment & Plan Note (Signed)
Mammogram ordered today  Patient given contact information to schedule mammogram

## 2022-08-23 NOTE — Assessment & Plan Note (Signed)
Continues to have dorsal soft tissue edema involving the left foot

## 2022-08-24 LAB — FERRITIN: Ferritin: 56 ng/mL (ref 15–150)

## 2022-08-24 LAB — COMPREHENSIVE METABOLIC PANEL
ALT: 13 IU/L (ref 0–32)
AST: 16 IU/L (ref 0–40)
Albumin/Globulin Ratio: 2 (ref 1.2–2.2)
Albumin: 4.3 g/dL (ref 3.9–4.9)
Alkaline Phosphatase: 84 IU/L (ref 44–121)
BUN/Creatinine Ratio: 19 (ref 9–23)
BUN: 15 mg/dL (ref 6–24)
Bilirubin Total: 0.4 mg/dL (ref 0.0–1.2)
CO2: 22 mmol/L (ref 20–29)
Calcium: 9.7 mg/dL (ref 8.7–10.2)
Chloride: 103 mmol/L (ref 96–106)
Creatinine, Ser: 0.79 mg/dL (ref 0.57–1.00)
Globulin, Total: 2.1 g/dL (ref 1.5–4.5)
Glucose: 91 mg/dL (ref 70–99)
Potassium: 3.8 mmol/L (ref 3.5–5.2)
Sodium: 143 mmol/L (ref 134–144)
Total Protein: 6.4 g/dL (ref 6.0–8.5)
eGFR: 91 mL/min/{1.73_m2} (ref >59)

## 2022-08-24 LAB — TSH+FREE T4
Free T4: 1.26 ng/dL (ref 0.82–1.77)
TSH: 1.17 u[IU]/mL (ref 0.450–4.500)

## 2022-08-24 LAB — CBC
Hematocrit: 41.6 % (ref 34.0–46.6)
Hemoglobin: 13.4 g/dL (ref 11.1–15.9)
MCH: 28.3 pg (ref 26.6–33.0)
MCHC: 32.2 g/dL (ref 31.5–35.7)
MCV: 88 fL (ref 79–97)
Platelets: 292 10*3/uL (ref 150–450)
RBC: 4.74 x10E6/uL (ref 3.77–5.28)
RDW: 14.7 % (ref 11.7–15.4)
WBC: 6.9 10*3/uL (ref 3.4–10.8)

## 2022-08-25 ENCOUNTER — Other Ambulatory Visit: Payer: Self-pay

## 2022-08-25 ENCOUNTER — Telehealth: Payer: Self-pay

## 2022-08-25 DIAGNOSIS — Z1211 Encounter for screening for malignant neoplasm of colon: Secondary | ICD-10-CM

## 2022-08-25 MED ORDER — NA SULFATE-K SULFATE-MG SULF 17.5-3.13-1.6 GM/177ML PO SOLN: 1.0000 | Freq: Once | ORAL | 0 refills | Status: AC

## 2022-08-25 NOTE — Telephone Encounter (Signed)
Gastroenterology Pre-Procedure Review  Request Date: 10/03/22 Requesting Physician: Dr. Allegra Lai  PATIENT REVIEW QUESTIONS: The patient responded to the following health history questions as indicated:    1. Are you having any GI issues? no 2. Do you have a personal history of Polyps? no 3. Do you have a family history of Colon Cancer or Polyps? no 4. Diabetes Mellitus? no 5. Joint replacements in the past 12 months?no 6. Major health problems in the past 3 months?no 7. Any artificial heart valves, MVP, or defibrillator?no    MEDICATIONS & ALLERGIES:    Patient reports the following regarding taking any anticoagulation/antiplatelet therapy:   Plavix, Coumadin, Eliquis, Xarelto, Lovenox, Pradaxa, Brilinta, or Effient? no Aspirin? no  Patient confirms/reports the following medications:  Current Outpatient Medications  Medication Sig Dispense Refill   ALPRAZolam (XANAX) 0.25 MG tablet Take 0.25 mg by mouth at bedtime as needed for anxiety. (Patient not taking: Reported on 08/23/2022)     hydrochlorothiazide (HYDRODIURIL) 25 MG tablet Take 25 mg by mouth daily.     valACYclovir (VALTREX) 1000 MG tablet Take 500 mg by mouth daily as needed.     No current facility-administered medications for this visit.    Patient confirms/reports the following allergies:  Allergies  Allergen Reactions   Prednisone Other (See Comments) and Swelling   Ciprofloxacin Other (See Comments)    Swelling and pain in hands, diarrhea    No orders of the defined types were placed in this encounter.   AUTHORIZATION INFORMATION Primary Insurance: 1D#: Group #:  Secondary Insurance: 1D#: Group #:  SCHEDULE INFORMATION: Date: 10/03/22 Time: Location: [facility]

## 2022-09-26 ENCOUNTER — Encounter: Payer: Self-pay | Admitting: Gastroenterology

## 2022-10-03 ENCOUNTER — Ambulatory Visit: Payer: Federal, State, Local not specified - PPO | Admitting: Anesthesiology

## 2022-10-03 ENCOUNTER — Ambulatory Visit
Admission: RE | Admit: 2022-10-03 | Discharge: 2022-10-03 | Disposition: A | Discharge status: Home / Self Care | Payer: Federal, State, Local not specified - PPO | Source: Ambulatory Visit | Attending: Gastroenterology | Admitting: Gastroenterology

## 2022-10-03 ENCOUNTER — Encounter
Admission: RE | Disposition: A | Discharge status: Home / Self Care | Payer: Self-pay | Source: Ambulatory Visit | Attending: Gastroenterology

## 2022-10-03 ENCOUNTER — Encounter: Payer: Self-pay | Admitting: Gastroenterology

## 2022-10-03 DIAGNOSIS — Z1211 Encounter for screening for malignant neoplasm of colon: Secondary | ICD-10-CM

## 2022-10-03 DIAGNOSIS — F419 Anxiety disorder, unspecified: Secondary | ICD-10-CM | POA: Diagnosis not present

## 2022-10-03 HISTORY — PX: COLONOSCOPY WITH PROPOFOL: SHX5780

## 2022-10-03 SURGERY — COLONOSCOPY WITH PROPOFOL
Anesthesia: General

## 2022-10-03 MED ORDER — PROPOFOL 500 MG/50ML IV EMUL
INTRAVENOUS | Status: DC | PRN
  Administered 2022-10-03: 150 ug/kg/min via INTRAVENOUS

## 2022-10-03 MED ORDER — PROPOFOL 1000 MG/100ML IV EMUL
INTRAVENOUS | Status: AC
  Filled 2022-10-03: qty 100

## 2022-10-03 MED ORDER — SODIUM CHLORIDE 0.9 % IV SOLN
INTRAVENOUS | Status: DC
  Administered 2022-10-03: 1000 mL via INTRAVENOUS

## 2022-10-03 MED ORDER — SIMETHICONE 40 MG/0.6ML PO SUSP
ORAL | Status: DC | PRN
  Administered 2022-10-03: 120 mL

## 2022-10-03 MED ORDER — DEXMEDETOMIDINE HCL IN NACL 200 MCG/50ML IV SOLN
INTRAVENOUS | Status: DC | PRN
  Administered 2022-10-03: 8 ug via INTRAVENOUS

## 2022-10-03 MED ORDER — LIDOCAINE HCL (CARDIAC) PF 100 MG/5ML IV SOSY
PREFILLED_SYRINGE | INTRAVENOUS | Status: DC | PRN
  Administered 2022-10-03: 40 mg via INTRAVENOUS

## 2022-10-03 MED ORDER — PROPOFOL 10 MG/ML IV BOLUS
INTRAVENOUS | Status: DC | PRN
  Administered 2022-10-03: 80 mg via INTRAVENOUS

## 2022-10-03 NOTE — H&P (Signed)
Arlyss Repress, MD [address]  [address]  [place], [place] [address]  Main: [phone]  Fax: [phone] Pager: [phone]  Primary Care Physician:  Ronnald Ramp, MD Primary Gastroenterologist:  Dr. Arlyss Repress  Pre-Procedure History & Physical: HPI:  Kimberly Good is a 52 y.o. female is here for an colonoscopy.   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 Authorizing Provider  ALPRAZolam Prudy Feeler) 0.25 MG tablet Take 0.25 mg by mouth at bedtime as needed for anxiety. Patient not taking: Reported on 08/23/2022 12/29/21   [provider]  hydrochlorothiazide (HYDRODIURIL) 25 MG tablet Take 25 mg by mouth daily. 12/29/21   [provider]  valACYclovir (VALTREX) 1000 MG tablet Take 500 mg by mouth daily as needed. Patient not taking: Reported on 09/26/2022 12/29/21   [provider]    Allergies as of 08/25/2022 - Review Complete 08/23/2022  Allergen Reaction Noted   Prednisone Other (See Comments) and Swelling 10/29/2020   Ciprofloxacin Other (See Comments) 10/29/2020    Family History  Problem Relation Age of Onset   High blood pressure Mother    High Cholesterol Mother    Diabetes type II Mother    High Cholesterol Father    Stroke Father    High blood pressure Father    Emphysema Father    Stroke Maternal Grandmother    High blood pressure Maternal  Grandmother    Diabetes Maternal Grandfather    High blood pressure Paternal Grandmother    Multiple sclerosis Sibling    High blood pressure Sibling     Social History   Socioeconomic History   Marital status: Married    Spouse name: Not on file   Number of children: Not on file   Years of education: Not on file   Highest education level: Not on file  Occupational History   Not on file  Tobacco Use   Smoking status: Never   Smokeless tobacco: Never  Vaping Use   Vaping Use: Never used  Substance and Sexual Activity   Alcohol use: Never   Drug use: Never   Sexual activity: Not on file  Other Topics Concern   Not on file  Social History Narrative   Not on file   Social Determinants of Health   Financial Resource Strain: Not on file  Food Insecurity: Not on file  Transportation Needs: Not on file  Physical Activity: Not on file  Stress: Not on file  Social Connections: Not on file  Intimate Partner Violence: Not on file    Review of Systems: See HPI, otherwise negative ROS  Physical Exam: BP (!) 147/76   Pulse 71   Temp (!) 96 F (35.6 C) (Temporal)   Resp 16   Ht 5' 3.5" (1.613 m)   Wt  110.8 kg   LMP 07/25/2022   SpO2 100%   BMI 42.60 kg/m  General:   Alert,  pleasant and cooperative in NAD Head:  Normocephalic and atraumatic. Neck:  Supple; no masses or thyromegaly. Lungs:  Clear throughout to auscultation.    Heart:  Regular rate and rhythm. Abdomen:  Soft, nontender and nondistended. Normal bowel sounds, without guarding, and without rebound.   Neurologic:  Alert and  oriented x4;  grossly normal neurologically.  Impression/Plan: Kimberly Good is here for an colonoscopy to be performed for colon cancer screening  Risks, benefits, limitations, and alternatives regarding  colonoscopy have been reviewed with the patient.  Questions have been answered.  All parties agreeable.   Lannette Donath, MD  10/03/2022, 8:46 AM

## 2022-10-03 NOTE — Anesthesia Procedure Notes (Signed)
Date/Time: 10/03/2022 8:50 AM  Performed by: Stormy Fabian, CRNAPre-anesthesia Checklist: Patient identified, Emergency Drugs available, Suction available and Patient being monitored Patient Re-evaluated:Patient Re-evaluated prior to induction Oxygen Delivery Method: Nasal cannula Induction Type: IV induction Dental Injury: Teeth and Oropharynx as per pre-operative assessment  Comments: Nasal cannula with etCO2 monitoring

## 2022-10-03 NOTE — Transfer of Care (Signed)
Immediate Anesthesia Transfer of Care Note  Patient: Kimberly Good  Procedure(s) Performed: Procedure(s): COLONOSCOPY WITH PROPOFOL (N/A)  Patient Location: PACU and Endoscopy Unit  Anesthesia Type:General  Level of Consciousness: sedated  Airway & Oxygen Therapy: Patient Spontanous Breathing and Patient connected to nasal cannula oxygen  Post-op Assessment: Report given to RN and Post -op Vital signs reviewed and stable  Post vital signs: Reviewed and stable  Last Vitals:  Vitals:   10/03/22 0805 10/03/22 0910  BP: (!) 147/76 (!) 89/55  Pulse: 71 68  Resp: 16 13  Temp: (!) 35.6 C (!) 35.6 C  SpO2: 100% 96%    Complications: No apparent anesthesia complications

## 2022-10-03 NOTE — Anesthesia Postprocedure Evaluation (Signed)
Anesthesia Post Note  Patient: Martena Emanuele  Procedure(s) Performed: COLONOSCOPY WITH PROPOFOL  Patient location during evaluation: Endoscopy Anesthesia Type: General Level of consciousness: awake and alert Pain management: pain level controlled Vital Signs Assessment: post-procedure vital signs reviewed and stable Respiratory status: spontaneous breathing, nonlabored ventilation, respiratory function stable and patient connected to nasal cannula oxygen Cardiovascular status: blood pressure returned to baseline and stable Postop Assessment: no apparent nausea or vomiting Anesthetic complications: no   No notable events documented.   Last Vitals:  Vitals:   10/03/22 0805 10/03/22 0910  BP: (!) 147/76 (!) 89/55  Pulse: 71 68  Resp: 16 13  Temp: (!) 35.6 C (!) 35.6 C  SpO2: 100% 96%    Last Pain:  Vitals:   10/03/22 0910  TempSrc:   PainSc: Asleep                 Louie Boston

## 2022-10-03 NOTE — Op Note (Signed)
[facility] Gastroenterology Patient Name: Kimberly Good Procedure Date: 10/03/2022 8:47 AM MRN: [id number] Account #: [id number] Date of Birth: January 29, 1971 Admit Type: Outpatient Age: 52 Room: [facility] ENDO ROOM 4 Gender: Female Note Status: Finalized Instrument Name: Prentice Docker [id number] Procedure:             Colonoscopy Indications:           Screening for colorectal malignant neoplasm, This is                         the patient's first colonoscopy Providers:             Toney Reil MD, MD Referring MD:          No Local Md, MD (Referring MD) Medicines:             General Anesthesia Complications:         No immediate complications. Estimated blood loss: None. Procedure:             Pre-Anesthesia Assessment:                        - Prior to the procedure, a History and Physical was                         performed, and patient medications and allergies were                         reviewed. The patient is competent. The risks and                         benefits of the procedure and the sedation options and                         risks were discussed with the patient. All questions                         were answered and informed consent was obtained.                         Patient identification and proposed procedure were                         verified by the physician, the nurse, the                         anesthesiologist, the anesthetist and the technician                         in the pre-procedure area in the procedure room in the                         endoscopy suite. Mental Status Examination: alert and                         oriented. Airway Examination: normal oropharyngeal                         airway and neck mobility. Respiratory Examination:  clear to auscultation. CV Examination: normal.                         Prophylactic Antibiotics: The patient does not require                         prophylactic  antibiotics. Prior Anticoagulants: The                         patient has taken no anticoagulant or antiplatelet                         agents. ASA Grade Assessment: III - A patient with                         severe systemic disease. After reviewing the risks and                         benefits, the patient was deemed in satisfactory                         condition to undergo the procedure. The anesthesia                         plan was to use general anesthesia. Immediately prior                         to administration of medications, the patient was                         re-assessed for adequacy to receive sedatives. The                         heart rate, respiratory rate, oxygen saturations,                         blood pressure, adequacy of pulmonary ventilation, and                         response to care were monitored throughout the                         procedure. The physical status of the patient was                         re-assessed after the procedure.                        After obtaining informed consent, the colonoscope was                         passed under direct vision. Throughout the procedure,                         the patient's blood pressure, pulse, and oxygen                         saturations were monitored continuously. The  Colonoscope was introduced through the anus and                         advanced to the the cecum, identified by appendiceal                         orifice and ileocecal valve. The colonoscopy was                         performed without difficulty. The patient tolerated                         the procedure well. The quality of the bowel                         preparation was evaluated using the BBPS [facility] Bowel                         Preparation Scale) with scores of: Right Colon = 3,                         Transverse Colon = 3 and Left Colon = 3 (entire mucosa                         seen  well with no residual staining, small fragments                         of stool or opaque liquid). The total BBPS score                         equals 9. The ileocecal valve, appendiceal orifice,                         and rectum were photographed. Findings:      The perianal and digital rectal examinations were normal. Pertinent       negatives include normal sphincter tone and no palpable rectal lesions.      The entire examined colon appeared normal.      The retroflexed view of the distal rectum and anal verge was normal and       showed no anal or rectal abnormalities. Impression:            - The entire examined colon is normal.                        - The distal rectum and anal verge are normal on                         retroflexion view.                        - No specimens collected. Recommendation:        - Discharge patient to home (with escort).                        - Resume previous diet today.                        -  Continue present medications.                        - Repeat colonoscopy in 10 years for screening                         purposes. Procedure Code(s):     --- Professional ---                        N8295, Colorectal cancer screening; colonoscopy on                         individual not meeting criteria for high risk Diagnosis Code(s):     --- Professional ---                        Z12.11, Encounter for screening for malignant neoplasm                         of colon CPT copyright 2022 American Medical Association. All rights reserved. The codes documented in this report are preliminary and upon coder review may  be revised to meet current compliance requirements. Dr. Libby Maw Toney Reil MD, MD 10/03/2022 9:09:46 AM This report has been signed electronically. Number of Addenda: 0 Note Initiated On: 10/03/2022 8:47 AM Scope Withdrawal Time: 0 hours 8 minutes 44 seconds  Total Procedure Duration: 0 hours 12 minutes 26 seconds   Estimated Blood Loss:  Estimated blood loss: none.      [facility]

## 2022-10-03 NOTE — Anesthesia Preprocedure Evaluation (Addendum)
Anesthesia Evaluation  Patient identified by MRN, date of birth, ID band Patient awake    Reviewed: Allergy & Precautions, NPO status , Patient's Chart, lab work & pertinent test results  History of Anesthesia Complications Negative for: history of anesthetic complications  Airway Mallampati: II  TM Distance: >3 FB Neck ROM: full    Dental no notable dental hx.    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: Hypertropia of left eye 10/19/2017: S/P eye surgery 02/07/2012: Tear of lateral cartilage or meniscus of knee, current  Past Surgical History: No date: ANTERIOR CRUCIATE LIGAMENT REPAIR No date: BREAST ENHANCEMENT SURGERY No date: BREAST SURGERY No date: COSMETIC SURGERY     Comment:  arm lift  No date: EYE SURGERY No date: FRACTURE SURGERY No date: MEDIAL COLLATERAL LIGAMENT AND LATERAL COLLATERAL LIGAMENT  REPAIR, ELBOW No date: TONSILLECTOMY     Reproductive/Obstetrics negative OB ROS                             Anesthesia Physical Anesthesia Plan  ASA: 3  Anesthesia Plan: General   Post-op Pain Management: Minimal or no pain anticipated   Induction: Intravenous  PONV Risk Score and Plan: Propofol infusion and TIVA  Airway Management Planned: Natural Airway and Nasal Cannula  Additional Equipment:   Intra-op Plan:   Post-operative Plan:   Informed  Consent: I have reviewed the patients History and Physical, chart, labs and discussed the procedure including the risks, benefits and alternatives for the proposed anesthesia with the patient or authorized representative who has indicated his/her understanding and acceptance.     Dental Advisory Given  Plan Discussed with: Anesthesiologist, CRNA and Surgeon  Anesthesia Plan Comments: (Patient consented for risks of anesthesia including but not limited to:  - adverse reactions to medications - risk of airway placement if required - damage to eyes, teeth, lips or other oral mucosa - nerve damage due to positioning  - sore throat or hoarseness - Damage to heart, brain, nerves, lungs, other parts of body or loss of life  Patient voiced understanding.)       Anesthesia Quick Evaluation

## 2022-10-04 ENCOUNTER — Encounter: Payer: Self-pay | Admitting: Gastroenterology

## 2022-10-24 ENCOUNTER — Encounter: Payer: Federal, State, Local not specified - PPO | Admitting: Family Medicine

## 2022-11-02 ENCOUNTER — Ambulatory Visit (INDEPENDENT_AMBULATORY_CARE_PROVIDER_SITE_OTHER): Payer: Federal, State, Local not specified - PPO | Admitting: Family Medicine

## 2022-11-02 DIAGNOSIS — Z91199 Patient's noncompliance with other medical treatment and regimen due to unspecified reason: Secondary | ICD-10-CM

## 2022-11-02 NOTE — Progress Notes (Signed)
Patient was not seen for appt d/t no call, no show, or late arrival >10 mins past appt time.   Kelyn Ponciano T Neela Zecca, FNP  [facility] [address] [place], [place] [address] [phone] (phone) [phone] (fax) [facility]  

## 2022-11-03 ENCOUNTER — Ambulatory Visit (INDEPENDENT_AMBULATORY_CARE_PROVIDER_SITE_OTHER): Payer: Federal, State, Local not specified - PPO | Admitting: Physician Assistant

## 2022-11-03 VITALS — BP 129/89 | HR 72 | Temp 98.2°F | Ht 63.0 in | Wt 251.0 lb

## 2022-11-03 DIAGNOSIS — R110 Nausea: Secondary | ICD-10-CM

## 2022-11-03 DIAGNOSIS — N951 Menopausal and female climacteric states: Secondary | ICD-10-CM | POA: Diagnosis not present

## 2022-11-03 DIAGNOSIS — R1031 Right lower quadrant pain: Secondary | ICD-10-CM

## 2022-11-03 LAB — POCT URINALYSIS DIPSTICK
Bilirubin, UA: NEGATIVE
Blood, UA: NEGATIVE
Glucose, UA: NEGATIVE
Ketones, UA: NEGATIVE
Leukocytes, UA: NEGATIVE
Nitrite, UA: NEGATIVE
Protein, UA: NEGATIVE
Spec Grav, UA: 1.01 (ref 1.010–1.025)
Urobilinogen, UA: 0.2 E.U./dL
pH, UA: 6 (ref 5.0–8.0)

## 2022-11-03 NOTE — Progress Notes (Signed)
Established patient visit  Patient: Kimberly Good   DOB: [birth year]   52 y.o. Female  MRN: [id number] Visit Date: 11/03/2022  Today's healthcare provider: Debera Lat, PA-C   Chief Complaint  Patient presents with   Abdominal Pain   Subjective     HPI   Patient present with possible hernia on her lower right side. Patient reports symptoms of RLQ pain.  She describes it as constant and dull, sometimes sharp and shooting.  She also has back pain, nausea and constipation.  She had normal colonoscopy about 3 weeks ago.   She is perimenopausal with her last period being 07/25/22.  Husband had vasectomy. Patient has started using a belly band. Last edited by Adline Peals, CMA on 11/03/2022  9:04 AM.      Cherlynn Perches drinking enough water Denies having kidney hx. Denies having blood in stool, urine, fever, recent viral infection. No pain with intercourse, or vaginal discharge. No menstrual period. Last MP was in April. Believes that she is in perimenopause with some anxiety symptoms and mild hot flashes. Denies having indigestion.    Had tummy tuck surgery in June 2017      11/03/2022    8:54 AM 08/23/2022    3:23 PM  Depression screen PHQ 2/9  Decreased Interest 0 0  Down, Depressed, Hopeless 0 0  PHQ - 2 Score 0 0   Medications: Outpatient Medications Prior to Visit  Medication Sig   hydrochlorothiazide (HYDRODIURIL) 25 MG tablet Take 25 mg by mouth daily. (Patient not taking: Reported on 11/03/2022)   No facility-administered medications prior to visit.    Review of Systems  All other systems reviewed and are negative.  Except see HPI      Objective    BP 129/89 (BP Location: Left Arm, Patient Position: Sitting, Cuff Size: Large)   Pulse 72   Temp 98.2 F (36.8 C) (Oral)   Ht 5\' 3"  (1.6 m)   Wt 251 lb (113.9 kg)   SpO2 100%   BMI 44.46 kg/m    Physical Exam Constitutional:      General: She is not in acute distress.    Appearance: Normal appearance.   HENT:     Head: Normocephalic and atraumatic.  Eyes:     Extraocular Movements: Extraocular movements intact.     Conjunctiva/sclera: Conjunctivae normal.     Pupils: Pupils are equal, round, and reactive to light.  Pulmonary:     Effort: Pulmonary effort is normal. No respiratory distress.  Abdominal:     General: Bowel sounds are normal. There is distension.     Palpations: Abdomen is soft. There is no mass.     Tenderness: There is no abdominal tenderness. There is no right CVA tenderness, left CVA tenderness, guarding or rebound.     Hernia: No hernia is present.  Neurological:     Mental Status: She is alert and oriented to person, place, and time. Mental status is at baseline.  Psychiatric:        Behavior: Behavior normal.        Thought Content: Thought content normal.        Judgment: Judgment normal.      Results for orders placed or performed in visit on 11/03/22  POCT urinalysis dipstick  Result Value Ref Range   Color, UA yellow    Clarity, UA clear    Glucose, UA Negative Negative   Bilirubin, UA neg    Ketones, UA neg    Spec Grav,  UA 1.010 1.010 - 1.025   Blood, UA neg    pH, UA 6.0 5.0 - 8.0   Protein, UA Negative Negative   Urobilinogen, UA 0.2 0.2 or 1.0 E.U./dL   Nitrite, UA neg    Leukocytes, UA Negative Negative   Appearance     Odor      Assessment & Plan    RLQ abdominal pain Mild pain, 3/10, x 1 mo or less Negative for fever, normal vitals On PE: no tenderness at McBurney point (1/3 the distance from the anterior superior iliac spine to the umbilicus), no guarding, no positive Rovsing sign, no Psoas sign, no Obturator sign Pt denies having any problems with urination. - CBC w/Diff/Platelet - C-reactive protein - POCT urinalysis dipstick negative Will reassess after  receiving lab results  Nausea New, most likely connected to BPPV a few days ago. Pt improved on Epley ... Exercises at home Improved Initial workup - CBC  w/Diff/Platelet - C-reactive protein - POCT urinalysis dipstick Will reassess after  receiving lab results  Perimenopause Last MP was in April of 2024. Mild hot flashes Does not have ObGyn in   Requested a referral to Gynecology  No follow-ups on file.     The patient was advised to call back or seek an in-person evaluation if the symptoms worsen or if the condition fails to improve as anticipated.  I discussed the assessment and treatment plan with the patient. The patient was provided an opportunity to ask questions and all were answered. The patient agreed with the plan and demonstrated an understanding of the instructions.  I, Debera Lat, PA-C have reviewed all documentation for this visit. The documentation on  11/03/22  for the exam, diagnosis, procedures, and orders are all accurate and complete.  Debera Lat, [facility], MMS [facility] [phone] (phone) [phone] (fax)  [facility]

## 2022-11-04 ENCOUNTER — Encounter: Payer: Self-pay | Admitting: Physician Assistant

## 2022-11-08 DIAGNOSIS — R1031 Right lower quadrant pain: Secondary | ICD-10-CM | POA: Diagnosis not present

## 2022-11-08 DIAGNOSIS — R232 Flushing: Secondary | ICD-10-CM | POA: Diagnosis not present

## 2022-11-22 ENCOUNTER — Telehealth: Payer: Self-pay

## 2022-11-22 DIAGNOSIS — Z1231 Encounter for screening mammogram for malignant neoplasm of breast: Secondary | ICD-10-CM

## 2022-11-22 NOTE — Telephone Encounter (Signed)
Copied from CRM [id number]. Topic: General - Other >> Nov 22, 2022 10:18 AM Kimberly Good wrote: Reason for CRM: Pt is requesting an order to have her mammogram done at [facility]. She is aware she will have to sign release for them to get film from prior imaging

## 2022-11-22 NOTE — Addendum Note (Signed)
Addended by: Hyacinth Meeker on: 11/22/2022 04:20 PM   Modules accepted: Orders

## 2022-11-28 DIAGNOSIS — R1031 Right lower quadrant pain: Secondary | ICD-10-CM | POA: Diagnosis not present

## 2022-11-29 DIAGNOSIS — R1031 Right lower quadrant pain: Secondary | ICD-10-CM | POA: Diagnosis not present

## 2022-11-29 DIAGNOSIS — N888 Other specified noninflammatory disorders of cervix uteri: Secondary | ICD-10-CM | POA: Diagnosis not present

## 2022-12-04 ENCOUNTER — Other Ambulatory Visit: Payer: Self-pay

## 2022-12-04 ENCOUNTER — Emergency Department (HOSPITAL_COMMUNITY)
Admission: EM | Admit: 2022-12-04 | Discharge: 2022-12-04 | Disposition: A | Discharge status: Home / Self Care | Payer: Federal, State, Local not specified - PPO | Attending: Emergency Medicine | Admitting: Emergency Medicine

## 2022-12-04 ENCOUNTER — Emergency Department (HOSPITAL_COMMUNITY): Payer: Federal, State, Local not specified - PPO

## 2022-12-04 ENCOUNTER — Encounter (HOSPITAL_COMMUNITY): Payer: Self-pay

## 2022-12-04 DIAGNOSIS — R0789 Other chest pain: Secondary | ICD-10-CM | POA: Diagnosis not present

## 2022-12-04 DIAGNOSIS — R0602 Shortness of breath: Secondary | ICD-10-CM | POA: Diagnosis not present

## 2022-12-04 DIAGNOSIS — R079 Chest pain, unspecified: Secondary | ICD-10-CM | POA: Diagnosis not present

## 2022-12-04 DIAGNOSIS — M549 Dorsalgia, unspecified: Secondary | ICD-10-CM | POA: Diagnosis not present

## 2022-12-04 DIAGNOSIS — M62830 Muscle spasm of back: Secondary | ICD-10-CM

## 2022-12-04 LAB — COMPREHENSIVE METABOLIC PANEL
ALT: 16 U/L (ref 0–44)
AST: 17 U/L (ref 15–41)
Albumin: 4.1 g/dL (ref 3.5–5.0)
Alkaline Phosphatase: 72 U/L (ref 38–126)
Anion gap: 10 (ref 5–15)
BUN: 18 mg/dL (ref 6–20)
CO2: 24 mmol/L (ref 22–32)
Calcium: 9.4 mg/dL (ref 8.9–10.3)
Chloride: 101 mmol/L (ref 98–111)
Creatinine, Ser: 0.88 mg/dL (ref 0.44–1.00)
GFR, Estimated: >60 mL/min (ref >60)
Glucose, Bld: 88 mg/dL (ref 70–99)
Potassium: 3.4 mmol/L — ABNORMAL LOW (ref 3.5–5.1)
Sodium: 135 mmol/L (ref 135–145)
Total Bilirubin: 0.8 mg/dL (ref 0.3–1.2)
Total Protein: 7.1 g/dL (ref 6.5–8.1)

## 2022-12-04 LAB — CBC WITH DIFFERENTIAL/PLATELET
Abs Immature Granulocytes: 0.03 10*3/uL (ref 0.00–0.07)
Basophils Absolute: 0 10*3/uL (ref 0.0–0.1)
Basophils Relative: 0 %
Eosinophils Absolute: 0.2 10*3/uL (ref 0.0–0.5)
Eosinophils Relative: 2 %
HCT: 40.9 % (ref 36.0–46.0)
Hemoglobin: 13.9 g/dL (ref 12.0–15.0)
Immature Granulocytes: 0 %
Lymphocytes Relative: 23 %
Lymphs Abs: 2.5 10*3/uL (ref 0.7–4.0)
MCH: 28.4 pg (ref 26.0–34.0)
MCHC: 34 g/dL (ref 30.0–36.0)
MCV: 83.6 fL (ref 80.0–100.0)
Monocytes Absolute: 0.8 10*3/uL (ref 0.1–1.0)
Monocytes Relative: 7 %
Neutro Abs: 7.3 10*3/uL (ref 1.7–7.7)
Neutrophils Relative %: 68 %
Platelets: 309 10*3/uL (ref 150–400)
RBC: 4.89 MIL/uL (ref 3.87–5.11)
RDW: 15 % (ref 11.5–15.5)
WBC: 10.8 10*3/uL — ABNORMAL HIGH (ref 4.0–10.5)
nRBC: 0 % (ref 0.0–0.2)

## 2022-12-04 LAB — TROPONIN I (HIGH SENSITIVITY): Troponin I (High Sensitivity): 2 ng/L (ref <18)

## 2022-12-04 MED ORDER — KETOROLAC TROMETHAMINE 30 MG/ML IJ SOLN
30.0000 mg | Freq: Once | INTRAMUSCULAR | Status: AC
  Administered 2022-12-04: 30 mg via INTRAVENOUS
  Filled 2022-12-04: qty 1

## 2022-12-04 MED ORDER — METHOCARBAMOL 500 MG PO TABS: 500.0000 mg | ORAL_TABLET | Freq: Two times a day (BID) | ORAL | 0 refills | Status: DC | PRN

## 2022-12-04 MED ORDER — MELOXICAM 15 MG PO TABS: 15.0000 mg | ORAL_TABLET | Freq: Every day | ORAL | 0 refills | Status: AC

## 2022-12-04 NOTE — ED Triage Notes (Signed)
Pt complains of chest pain and SOB that started suddenly this evening. Pt took 4 baby asa.

## 2022-12-04 NOTE — ED Provider Notes (Signed)
[place] EMERGENCY DEPARTMENT AT [facility] Provider Note   CSN: [id number] Arrival date & time: 12/04/22  1828     History  Chief Complaint  Patient presents with   Chest Pain   Shortness of Breath    Glendora Alling is a 52 y.o. female presenting for right sided mid back pain after she was floating on a pool noodle and tried to stand up. Pain started acutely, rates it a 7/10, pain is described as a tightness, she is having a hard time taking a deep breath in secondary to pain, pain radiates from the back to the lateral side of chest, sligthly worse with movement. She has not tried anythign for pain. Reports past medical history of lower back pain. Denies CP, SOB, abd pain, weakness or loss of bowel or bladder. Not currently taking any medications.        Home Medications Prior to Admission medications   Medication Sig Start Date End Date Taking? Authorizing Provider  hydrochlorothiazide (HYDRODIURIL) 25 MG tablet Take 25 mg by mouth daily. Patient not taking: Reported on 11/03/2022 12/29/21   [provider]      Allergies    Prednisone and Ciprofloxacin    Review of Systems   Review of Systems  Respiratory:  Positive for chest tightness. Negative for cough, shortness of breath and wheezing.   Cardiovascular:  Negative for chest pain and palpitations.  Gastrointestinal:  Negative for abdominal distention, constipation, diarrhea, nausea and vomiting.  Musculoskeletal:  Positive for back pain.    Physical Exam Updated Vital Signs BP 130/76 (BP Location: Left Arm)   Pulse 86   Temp 98 F (36.7 C) (Oral)   Resp 20   Ht 5\' 3"  (1.6 m)   Wt 113.4 kg   LMP 11/28/2022 (Exact Date)   SpO2 96%   BMI 44.29 kg/m  Physical Exam Vitals and nursing note reviewed.  Constitutional:      General: She is in acute distress.     Appearance: She is not ill-appearing or toxic-appearing.     Comments: Pt appears in pain  HENT:     Head: Normocephalic and  atraumatic.  Eyes:     General: No scleral icterus.    Conjunctiva/sclera: Conjunctivae normal.  Cardiovascular:     Rate and Rhythm: Normal rate and regular rhythm.     Pulses: Normal pulses.     Heart sounds: Normal heart sounds.  Pulmonary:     Effort: Pulmonary effort is normal. No respiratory distress.     Breath sounds: Normal breath sounds.  Abdominal:     General: Abdomen is flat. Bowel sounds are normal.     Palpations: Abdomen is soft.     Tenderness: There is no abdominal tenderness.  Musculoskeletal:     Cervical back: Normal.     Thoracic back: Tenderness present. No swelling, edema, deformity or bony tenderness.     Lumbar back: Normal.  Skin:    General: Skin is warm and dry.     Findings: No lesion.  Neurological:     General: No focal deficit present.     Mental Status: She is alert and oriented to person, place, and time. Mental status is at baseline.     ED Results / Procedures / Treatments   Labs (all labs ordered are listed, but only abnormal results are displayed) Labs Reviewed - No data to display  EKG EKG Interpretation Date/Time:  Sunday December 04 2022 18:37:10 EDT Ventricular Rate:  92 PR  Interval:  174 QRS Duration:  111 QT Interval:  380 QTC Calculation: 471 R Axis:   -9  Text Interpretation: Sinus rhythm Low voltage, precordial leads RSR' in V1 or V2, right VCD or RVH Nonspecific T abnormalities, anterior leads Confirmed by Eber Hong ([id number]) on 12/04/2022 6:42:50 PM  Radiology DG Chest 2 View  Result Date: 12/04/2022 CLINICAL DATA:  right mid back pain, acute onset EXAM: CHEST - 2 VIEW COMPARISON:  Chest x-ray 09/26/20 FINDINGS: The heart and mediastinal contours are within normal limits. No focal consolidation. No pulmonary edema. No pleural effusion. No pneumothorax. No acute osseous abnormality. IMPRESSION: No active cardiopulmonary disease. Electronically Signed   By: Tish Frederickson M.D.   On: 12/04/2022 20:06     Procedures Procedures    Medications Ordered in ED Medications  ketorolac (TORADOL) 30 MG/ML injection 30 mg (30 mg Intravenous Given 12/04/22 2007)    ED Course/ Medical Decision Making/ A&P                                 Medical Decision Making Amount and/or Complexity of Data Reviewed Labs: ordered. Radiology: ordered.  Risk Prescription drug management.   This patient presents to the ED for concern of upper back pain and difficulty taking a deep breath in d/t pain, this involves an extensive number of treatment options, and is a complaint that carries with it a high risk of complications and morbidity.  The differential diagnosis includes ACS, back pain, muscle spasm, rib subluxation, kidney stone, rash, fracture   Co morbidities that complicate the patient evaluation  Denies previous cardiovascular or pulmonary history    Additional history obtained:  Additional history obtained from family at bedside with patient    Lab Tests:  I Ordered, and personally interpreted labs.  The pertinent results include:   Cbc unremarkable  Bmp unremarkable  Trop unremarkable    Imaging Studies ordered:  I ordered imaging studies including chest xray   I independently visualized and interpreted imaging which showed no acute cardiopulmonary abnormality  I agree with the radiologist interpretation   Cardiac Monitoring: / EKG:  The patient was maintained on a cardiac monitor.  I personally viewed and interpreted the cardiac monitored which showed an underlying rhythm of: sinus rhythm, no ST elevation    Consultations Obtained:  No consult required d/t imaging and lab findings    Problem List / ED Course / Critical interventions / Medication management  Pt is presenting with right sided mid back pain after non-traumatic event. After labs and imaging it suggest MSK findings  I ordered medication including Tordal  for pain   Reevaluation of the patient after these  medicines showed that the patient improved I have reviewed the patients home medicines and have made adjustments as needed   Plan Pt sent home with Robaxin and Meloxicam. Alternate Meloxicam and tylenol for pain. Can you heat or ice for further pain control.  Return to ER if sx continue or worsen. Pt and family educated on concerning sx that would warrant need for return.  F/u w/ PCP 2-3 days        Final Clinical Impression(s) / ED Diagnoses Final diagnoses:  None    Rx / DC Orders ED Discharge Orders     None         Raford Pitcher Evalee Jefferson 12/04/22 2200    Eber Hong, MD 12/05/22 [phone]

## 2022-12-04 NOTE — Discharge Instructions (Signed)
You were seen in the emergency room for back pain. After lab work and imaging, the back pain appears to be musculoskeletal in nature.   I have sent a muscle relaxer and anti-inflammatory medication (Meloxicam) to the pharmacy. I would recommend alternating the anti-inflammatory medication with Tylenol as needed for pain. You can also apply ice or heat for better pain control. Take Meloxicam with food and continue taking as prescribed while back pain continues.   Return to ER if pain continues or worsen. Follow up with your PCP in 2-3days.

## 2022-12-04 NOTE — ED Notes (Signed)
Pt states that it hurts more to breathe deeply, as in when auscultating lung sounds.

## 2022-12-09 DIAGNOSIS — D259 Leiomyoma of uterus, unspecified: Secondary | ICD-10-CM | POA: Diagnosis not present

## 2022-12-27 ENCOUNTER — Encounter: Payer: Self-pay | Admitting: Physician Assistant

## 2022-12-29 ENCOUNTER — Encounter: Payer: Self-pay | Admitting: Family Medicine

## 2023-01-03 ENCOUNTER — Encounter: Payer: Self-pay | Admitting: Family Medicine

## 2023-01-03 ENCOUNTER — Ambulatory Visit (INDEPENDENT_AMBULATORY_CARE_PROVIDER_SITE_OTHER): Payer: Federal, State, Local not specified - PPO | Admitting: Family Medicine

## 2023-01-03 VITALS — BP 129/82 | HR 75 | Temp 97.7°F | Ht 63.0 in | Wt 257.0 lb

## 2023-01-03 DIAGNOSIS — M25562 Pain in left knee: Secondary | ICD-10-CM

## 2023-01-03 DIAGNOSIS — Z131 Encounter for screening for diabetes mellitus: Secondary | ICD-10-CM

## 2023-01-03 DIAGNOSIS — Z0001 Encounter for general adult medical examination with abnormal findings: Secondary | ICD-10-CM

## 2023-01-03 DIAGNOSIS — Z130 Encounter for screening for diseases of the blood and blood-forming organs and certain disorders involving the immune mechanism: Secondary | ICD-10-CM

## 2023-01-03 DIAGNOSIS — Z114 Encounter for screening for human immunodeficiency virus [HIV]: Secondary | ICD-10-CM

## 2023-01-03 DIAGNOSIS — Z0000 Encounter for general adult medical examination without abnormal findings: Secondary | ICD-10-CM | POA: Insufficient documentation

## 2023-01-03 DIAGNOSIS — R600 Localized edema: Secondary | ICD-10-CM | POA: Diagnosis not present

## 2023-01-03 DIAGNOSIS — Z1159 Encounter for screening for other viral diseases: Secondary | ICD-10-CM

## 2023-01-03 DIAGNOSIS — Z13220 Encounter for screening for lipoid disorders: Secondary | ICD-10-CM

## 2023-01-03 MED ORDER — HYDROCHLOROTHIAZIDE 25 MG PO TABS
25.0000 mg | ORAL_TABLET | Freq: Every day | ORAL | 1 refills | Status: DC
Start: 2023-01-03 — End: 2023-05-03

## 2023-01-03 NOTE — Progress Notes (Signed)
Complete physical exam   Patient: Kimberly Good   DOB: 1970-04-27   52 y.o. Female  MRN: [id number] Visit Date: 01/03/2023  Today's healthcare provider: Ronnald Ramp, MD   Chief Complaint  Patient presents with   Annual Exam   Subjective    [name] is a 52 y.o. female who presents today for a complete physical exam.   She reports consuming a general diet.   Exercise is limited by orthopedic condition(s): involving her left knee .   She generally feels well.   She reports sleeping well.    She does have additional problems to discuss today.   Health Mainteance:  Last pap smear 8.2021 with normal results, currently follows with GYN at [facility], scheduled for robot assisted TLH with BSO 01/20/23 for prolapsed fibroid  Recommended Influenza vaccine Last tetanus vaccine in EMR was in April 2022  Recommended Shingrix vaccine   Discussed the use of AI scribe software for clinical note transcription with the patient, who gave verbal consent to proceed.  History of Present Illness   The patient, with a history of cervical issues and knee pain, is scheduled for a partial hysterectomy due to a prolapsed fibroid and other fibroids. She expresses eagerness for the procedure, hoping it will alleviate chronic right-sided pain, bloating, abdominal cramping, and lethargy. She reports feeling confident in her surgeon and is optimistic about the outcome, but sought a second opinion regarding potential side effects.  The patient also reports a recent episode of severe right-sided knee pain, which has escalated over the past six days. This pain has limited her physical activity, and she has been managing it with icing, elevation, compression, and ibuprofen. She has a history of knee surgery and is currently seeking an orthopedic referral for further evaluation.  In addition to these issues, the patient experienced an acute episode of chest discomfort and  difficulty breathing while in a pool, which led to an emergency room visit. She was prescribed a muscle relaxer and anti-inflammatories, which resolved the issue.  The patient also mentions a recent increase in weight, which she attributes to baking and consuming large amounts of bread. She expresses a desire to set and work towards a weight loss goal. She has restarted taking hydrochlorothiazide for water retention and has begun taking fish oil and plan to start vitamin B12 for overall health benefits.  Lastly, the patient reports a self-inflicted ice burn on her knee due to icing without a barrier. She has limited sensation in the area due to previous surgery and is seeking advice on wound care.         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   S/P eye surgery 10/19/2017   Tear of lateral cartilage or meniscus of knee, current 02/07/2012   Past Surgical History:  Procedure Laterality Date   ANTERIOR CRUCIATE LIGAMENT REPAIR     BREAST ENHANCEMENT SURGERY     BREAST SURGERY     COLONOSCOPY WITH PROPOFOL N/A 10/03/2022   Procedure: COLONOSCOPY WITH PROPOFOL;  Surgeon: Toney Reil, MD;  Location: [facility] ENDOSCOPY;  Service: Gastroenterology;  Laterality: N/A;   COSMETIC SURGERY     arm lift    EYE SURGERY     FRACTURE SURGERY     MEDIAL COLLATERAL LIGAMENT AND LATERAL COLLATERAL LIGAMENT REPAIR, ELBOW     TONSILLECTOMY     Social History   Socioeconomic  History   Marital status: Married    Spouse name: Not on file   Number of children: Not on file   Years of education: Not on file   Highest education level: Not on file  Occupational History   Not on file  Tobacco Use   Smoking status: Never   Smokeless tobacco: Never  Vaping Use   Vaping status: Never Used  Substance and Sexual Activity   Alcohol use: Never   Drug use: Never   Sexual activity: Not on file  Other Topics Concern    Not on file  Social History Narrative   Not on file   Social Determinants of Health   Financial Resource Strain: Not on file  Food Insecurity: Not on file  Transportation Needs: Not on file  Physical Activity: Not on file  Stress: Not on file  Social Connections: Unknown (05/05/2022)   Received from [facility], [facility]   Social Network    Social Network: Not on file  Intimate Partner Violence: Unknown (05/05/2022)   Received from [employer], [facility]   HITS    Physically Hurt: Not on file    Insult or Talk Down To: Not on file    Threaten Physical Harm: Not on file    Scream or Curse: Not on file   Family Status  Relation Name Status   Mother  Alive   Father  Alive   MGM  Deceased   MGF  Deceased   PGM  Deceased   Sib  Deceased  No partnership data on file   Family History  Problem Relation Age of Onset   High blood pressure Mother    High Cholesterol Mother    Diabetes type II Mother    High Cholesterol Father    Stroke Father    High blood pressure Father    Emphysema Father    Stroke Maternal Grandmother    High blood pressure Maternal Grandmother    Diabetes Maternal Grandfather    High blood pressure Paternal Grandmother    Multiple sclerosis Sibling    High blood pressure Sibling    Allergies  Allergen Reactions   Prednisone Other (See Comments) and Swelling   Ciprofloxacin Other (See Comments)    Swelling and pain in hands, diarrhea     Medications: Outpatient Medications Prior to Visit  Medication Sig   methocarbamol (ROBAXIN) 500 MG tablet Take 1 tablet (500 mg total) by mouth 2 (two) times daily as needed for muscle spasms.   [DISCONTINUED] hydrochlorothiazide (HYDRODIURIL) 25 MG tablet Take 25 mg by mouth daily.   No facility-administered medications prior to visit.    Review of Systems     Objective    BP 129/82 (BP Location: Right Arm, Patient Position: Sitting, Cuff Size: Large)   Pulse 75   Temp 97.7 F (36.5 C)  (Oral)   Ht 5\' 3"  (1.6 m)   Wt 257 lb (116.6 kg)   LMP 11/28/2022 (Exact Date)   SpO2 97%   BMI 45.53 kg/m  BP Readings from Last 3 Encounters:  01/03/23 129/82  12/04/22 (!) 121/53  11/03/22 129/89        Physical Exam Vitals reviewed.  Constitutional:      General: She is not in acute distress.    Appearance: Normal appearance. She is not ill-appearing, toxic-appearing or diaphoretic.  HENT:     Head: Normocephalic and atraumatic.     Right Ear: Tympanic membrane and external ear normal. There is no impacted  cerumen.     Left Ear: Tympanic membrane and external ear normal. There is no impacted cerumen.     Nose: Nose normal.     Mouth/Throat:     Pharynx: Oropharynx is clear.  Eyes:     General: No scleral icterus.    Extraocular Movements: Extraocular movements intact.     Conjunctiva/sclera: Conjunctivae normal.     Pupils: Pupils are equal, round, and reactive to light.  Cardiovascular:     Rate and Rhythm: Normal rate and regular rhythm.     Pulses: Normal pulses.     Heart sounds: Normal heart sounds. No murmur heard.    No friction rub. No gallop.  Pulmonary:     Effort: Pulmonary effort is normal. No respiratory distress.     Breath sounds: Normal breath sounds. No wheezing, rhonchi or rales.  Abdominal:     General: Bowel sounds are normal. There is no distension.     Palpations: Abdomen is soft. There is no mass.     Tenderness: There is no abdominal tenderness. There is no guarding.  Musculoskeletal:        General: No deformity.     Cervical back: Normal range of motion and neck supple. No rigidity.     Right lower leg: Edema present.     Left lower leg: Edema present.  Lymphadenopathy:     Cervical: No cervical adenopathy.  Skin:    General: Skin is warm.     Capillary Refill: Capillary refill takes less than 2 seconds.     Findings: No erythema or rash.     Comments: Abrasion on left inferior knee, healing   Neurological:     General: No focal  deficit present.     Mental Status: She is alert and oriented to person, place, and time.     Motor: No weakness.     Gait: Gait normal.  Psychiatric:        Mood and Affect: Mood normal.        Behavior: Behavior normal.       Last depression screening scores    11/03/2022    8:54 AM 08/23/2022    3:23 PM  PHQ 2/9 Scores  PHQ - 2 Score 0 0    Last fall risk screening    08/23/2022    3:23 PM  Fall Risk   Falls in the past year? 0  Number falls in past yr: 0  Injury with Fall? 0  Risk for fall due to : No Fall Risks    Last Audit-C alcohol use screening    08/23/2022    3:23 PM  Alcohol Use Disorder Test (AUDIT)  1. How often do you have a drink containing alcohol? 0  2. How many drinks containing alcohol do you have on a typical day when you are drinking? 0  3. How often do you have six or more drinks on one occasion? 0  AUDIT-C Score 0   A score of 3 or more in women, and 4 or more in men indicates increased risk for alcohol abuse, EXCEPT if all of the points are from question 1   No results found for any visits on 01/03/23.  Assessment & Plan    Routine Health Maintenance and Physical Exam  Immunization History  Administered Date(s) Administered   Td (Adult),5 Lf Tetanus Toxid, Preservative Free 08/17/2020   Tdap 12/12/2012, 08/02/2020    Health Maintenance  Topic Date Due   HIV Screening  Never  done   Hepatitis C Screening  Never done   Zoster Vaccines- Shingrix (1 of 2) Never done   INFLUENZA VACCINE  Never done   COVID-19 Vaccine (1 - 2023-24 season) Never done   MAMMOGRAM  01/02/2023   PAP SMEAR-Modifier  12/23/2022   DTaP/Tdap/Td (4 - Td or Tdap) 08/18/2030   Colonoscopy  10/02/2032   HPV VACCINES  Aged Out    Problem List Items Addressed This Visit     Annual physical exam - Primary    -Order labs for physical, including A1C, CBC, complete metabolic panel, lipid panel, HIV and Hepatitis C screening, and thyroid levels. Patient to complete  labs on 01/09/2023 or 01/10/2023. 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 -Refilled hydrochlorothiazide 25mg . -Continue monitoring for swelling.      Relevant Medications   hydrochlorothiazide (HYDRODIURIL) 25 MG tablet   Other Relevant Orders   CBC   CMP14+EGFR   TSH+T4F+T3Free   Other Visit Diagnoses     Encounter for hepatitis C screening test for low risk patient       Relevant Orders   Hepatitis C antibody   Screening for HIV (human immunodeficiency virus)       Relevant Orders   HIV Antibody (routine testing w rflx)   Screening for lipid disorders       Relevant Orders   Lipid panel   Screening for diabetes mellitus       Relevant Orders   Hemoglobin A1c   Screening for deficiency anemia       Relevant Orders   CBC   Medial knee pain, left       Relevant Orders   AMB referral to orthopedics           Uterine Fibroids Scheduled for partial hysterectomy due to prolapsed fibroid and other fibroids. Anticipated improvement in symptoms of bloating, abdominal cramping, and lethargy post-surgery. -Continue with planned partial hysterectomy, leaving ovaries intact to prevent sudden menopause.  Knee Pain Acute exacerbation of chronic knee pain, possibly related to previous surgery. Pain localized to the front inner (medial) aspect of the knee. -Refer to orthopedic specialist for further evaluation and management. -Continue with current pain management strategies including icing, elevating, and compressing.  Obesity BMI increased to 45 from 44. Patient is goal-oriented and willing to make lifestyle changes. -Set initial goal to reduce BMI to under 40. -Recommend 150 minutes of moderate-intensity aerobic activity per week, as tolerated post-surgery. -Encourage dietary  modifications, specifically reducing intake of homemade bread.        Return in about 3 months (around 04/04/2023) for Weight MGMT.       Ronnald Ramp, MD  [facility] [phone] (phone) [phone] (fax)  [facility]

## 2023-01-03 NOTE — Assessment & Plan Note (Signed)
-  Order labs for physical, including A1C, CBC, complete metabolic panel, lipid panel, HIV and Hepatitis C screening, and thyroid levels. Patient to complete labs on 01/09/2023 or 01/10/2023. -Schedule follow-up appointment in December 2024 to assess recovery from surgery and progress towards weight loss goals

## 2023-01-03 NOTE — Patient Instructions (Signed)
VISIT SUMMARY:  During our visit, we discussed your upcoming partial hysterectomy for your uterine fibroids, your ongoing knee pain, your weight gain, and your self-inflicted ice burn. We also discussed your general health and plans for future care.  YOUR PLAN:  -UTERINE FIBROIDS: You are scheduled for a partial hysterectomy due to a prolapsed fibroid and other fibroids. This procedure should help alleviate your symptoms of bloating, abdominal cramping, and lethargy. We will leave your ovaries intact to prevent sudden menopause.  -KNEE PAIN: Your knee pain has worsened recently, possibly due to your previous surgery. We will refer you to an orthopedic specialist for further evaluation and management. In the meantime, continue managing your pain with icing, elevation, and compression.  -OBESITY: Your BMI has increased slightly. We will set an initial goal to reduce your BMI to under 40. I recommend 150 minutes of moderate-intensity aerobic activity per week, as tolerated after your surgery, and reducing your intake of homemade bread.  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 We will refill this prescription and continue monitoring for swelling.  -ICE BURN: You have a self-inflicted ice burn on your knee due to icing without a barrier. We will provide advice on wound care.  INSTRUCTIONS:  Please complete your labs on either 01/09/2023 or 01/10/2023. These labs will include tests for A1C, CBC, complete metabolic panel, lipid panel, HIV and Hepatitis C screening, and thyroid levels. We will schedule a follow-up appointment in December 2024 to assess your recovery from surgery and progress towards your weight loss goals.

## 2023-01-03 NOTE — Assessment & Plan Note (Signed)
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 -Refilled hydrochlorothiazide 25mg . -Continue monitoring for swelling.

## 2023-01-09 ENCOUNTER — Ambulatory Visit
Admission: RE | Admit: 2023-01-09 | Discharge: 2023-01-09 | Disposition: A | Discharge status: Home / Self Care | Payer: Federal, State, Local not specified - PPO | Source: Ambulatory Visit | Attending: Family Medicine | Admitting: Family Medicine

## 2023-01-09 DIAGNOSIS — Z1231 Encounter for screening mammogram for malignant neoplasm of breast: Secondary | ICD-10-CM | POA: Insufficient documentation

## 2023-01-10 ENCOUNTER — Other Ambulatory Visit: Payer: Self-pay | Admitting: Obstetrics and Gynecology

## 2023-01-10 DIAGNOSIS — Z01818 Encounter for other preprocedural examination: Secondary | ICD-10-CM | POA: Diagnosis not present

## 2023-01-10 DIAGNOSIS — D259 Leiomyoma of uterus, unspecified: Secondary | ICD-10-CM | POA: Diagnosis not present

## 2023-01-10 NOTE — H&P (Signed)
Kimberly Good is a 52 y.o. female presenting for  History of Present Illness: Patient returns today for preop for hysterectomy. She was seen by me on 12/09/22 for possible cervical polyp which I found to be a prolapsed fibroid. After discussion of her options, she has decided to proceed with a hysterectomy. Her periods are heavy and crampy.   Workup: Pap:11/2019 neg/neg  EMBx: unable to be obtained in the office d/t location of fibroid.   TVUS 11/28/22: Uterus retroverted, 7 x 3.8 x 3.4 cm  Endometrium: 7.4 mm  Bilateral ovaries wnl Mid fibroid: 1.6 cm  Hypoechoic cervical mass with internal vascularity measuring 26 mm as detailed above. Suggest clinical correlation on exam.  Past Medical History:  has a past medical history of PONV (postoperative nausea and vomiting).  Past Surgical History:  has a past surgical history that includes skin removal surgeries; Breast surgery; left knee arthroscopy (2013); strabismus surgery 1 vertical muscle superior rectus (Bilateral, 10/19/2017); strabismus surgery 1 horizontal muscle medial/lateral (Bilateral, 10/19/2017); strabismus surgery w/placement adjustable suture (Bilateral, 10/19/2017); and Colonoscopy (2024). Family History: family history is not on file. Social History:  reports that she has never smoked. She has never used smokeless tobacco. She reports that she does not currently use alcohol. OB/GYN History:  OB History  No obstetric history on file.   Allergies: is allergic to prednisone and ciprofloxacin. Medications: Current Outpatient Medications:    aspirin 81 MG EC tablet, Take 81 mg by mouth every 12 (twelve) hours, Disp: , Rfl:    citalopram (CELEXA) 10 MG tablet, Take 10 mg by mouth once daily, Disp: , Rfl:    valACYclovir (VALTREX) 1000 MG tablet, TAKE 2 TABLETS BY MOUTH TWICE DAILY FOR 1 DAY AS NEEDED FOR COLD SORES, Disp: , Rfl:   Review of Systems: No SOB, no palpitations or chest pain, no new lower extremity edema, no  nausea or vomiting or bowel or bladder complaints. See HPI for gyn specific ROS.   Exam:  BP 135/88   Pulse 93   Ht 160 cm (5\' 3" )   Wt (!) 115.2 kg (409 lb)   LMP 12/27/2022 (Approximate)   BMI 44.99 kg/m   Constitutional:  General appearance: Well nourished, well developed female in no acute distress.  Neuro/psych:  Normal mood and affect. No gross motor deficits. Neck:  Supple, normal appearance.  Respiratory:  Normal respiratory effort, no use of accessory muscles Skin:  No visible rashes or external lesions    Impression:  The primary encounter diagnosis was Fibroid of cervix. A diagnosis of Preop examination was also pertinent to this visit.  Plan:  1. Preop exam, Prolapsed fibroid  -Patient returns for a preoperative discussion regarding her plans to proceed with surgical treatment of her prolapsed fibroid by RA total laparoscopic hysterectomy with bilateral salpingectomy procedure.  We may perform a cystoscopy to evaluate the urinary tract after the procedure, if surgically indicated for uro tract integrity.    The patient and I discussed the technical aspects of the procedure including the potential for risks and complications.These include but are not limited to the risk of infection requiring post-operative antibiotics or further procedures. We talked about the risk of injury to adjacent organs including bladder, bowel, ureter, blood vessels or nerves. We talked about the need to convert to an open incision. We talked about the possible need for blood transfusion. We talked about postop complications such as thromboembolic or cardiopulmonary complications.  All of her questions were answered. Her preoperative exam was completed  and the appropriate consents were signed. She is scheduled to undergo this procedure in the near future.   She has had a abdominoplasty and has a new umbilicus. Will enter from [place] [facility]    Diagnoses and all orders for this  visit:  Fibroid of cervix  Preop examination   Return for Postop check. ~~~~~~~~~~~~~~~~~~~~~~~~~~~~~~~~~~~~~~~~~~~~~~~~~~~~~~~~~~~~ This note is partially written by Jerene Canny, in the presence of and acting as the scribe of Dr. Christeen Douglas, who has reviewed, edited and added to the note to reflect her best personal medical judgment.  This note was generated in part with voice recognition software and I apologize for any typographical errors that were not detected and corrected.   Attestation Statement:  I personally performed the service. (TP)  Joron Velis Babette Relic, MD

## 2023-01-11 ENCOUNTER — Ambulatory Visit: Payer: Federal, State, Local not specified - PPO | Admitting: Orthopedic Surgery

## 2023-01-11 ENCOUNTER — Other Ambulatory Visit (INDEPENDENT_AMBULATORY_CARE_PROVIDER_SITE_OTHER): Payer: Federal, State, Local not specified - PPO

## 2023-01-11 ENCOUNTER — Encounter: Payer: Self-pay | Admitting: Orthopedic Surgery

## 2023-01-11 DIAGNOSIS — M25562 Pain in left knee: Secondary | ICD-10-CM

## 2023-01-11 DIAGNOSIS — M25462 Effusion, left knee: Secondary | ICD-10-CM

## 2023-01-11 NOTE — Progress Notes (Signed)
Office Visit Note   Patient: Kimberly Good           Date of Birth: 21-May-1970           MRN: [id number] Visit Date: 01/11/2023 Requested by: Ronnald Ramp, MD [address] [place],  [place] [address] PCP: Ronnald Ramp, MD  Subjective: Chief Complaint  Patient presents with   Left Knee - Pain    HPI: Kimberly Good is a 52 y.o. female who presents to the office reporting left knee pain.  Patient had prior knee surgery in 2015 in [place] which involved MCL ACL and meniscus.  She describes atraumatic onset of pain in that left knee for the past 4 weeks.  Reports increased tenderness in the knee and she has been ambulating with a limp.  Tried rest ice compression elevation without much relief.  Does have pain every step which is primarily medial and retropatellar.  Denies much in the way of instability symptoms.  Had foot surgery in January 2024 with extensive recovery which was 8 weeks of nonweightbearing.  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  Has partial hysterectomy planned for next Thursday.  Does report swelling in the knee.  Ibuprofen helps..                ROS: All systems reviewed are negative as they relate to the chief complaint within the history of present illness.  Patient denies fevers or chills.  Assessment & Plan: Visit Diagnoses:  1. Left knee pain, unspecified chronicity     Plan: Impression is left knee pain with stable knee and mild effusion.  Aspiration and injection performed in the knee today.  We will see her back in 6 weeks with decision for or against MRI scanning.  Her ligaments feel stable.  This could be meniscal pathology or occult arthritis.  Follow-Up Instructions: No follow-ups on file.   Orders:  Orders Placed This Encounter  Procedures   XR KNEE 3 VIEW LEFT   No orders of the defined types were placed in this encounter.     Procedures: Large Joint Inj: L knee on 01/11/2023  10:26 PM Indications: diagnostic evaluation, joint swelling and pain Details: 18 G 1.5 in needle, superolateral approach  Arthrogram: No  Medications: 5 mL lidocaine 1 %; 40 mg methylPREDNISolone acetate 40 MG/ML; 4 mL bupivacaine 0.25 % Outcome: tolerated well, no immediate complications Procedure, treatment alternatives, risks and benefits explained, specific risks discussed. Consent was given by the patient. Immediately prior to procedure a time out was called to verify the correct patient, procedure, equipment, support staff and site/side marked as required. Patient was prepped and draped in the usual sterile fashion.       Clinical Data: No additional findings.  Objective: Vital Signs: LMP 12/12/2022   Physical Exam:  Constitutional: Patient appears well-developed HEENT:  Head: Normocephalic Eyes:EOM are normal Neck: Normal range of motion Cardiovascular: Normal rate Pulmonary/chest: Effort normal Neurologic: Patient is alert Skin: Skin is warm Psychiatric: Patient has normal mood and affect  Ortho Exam: Ortho exam demonstrates well-healed surgical incisions on the left knee.  She stable to varus and valgus stress at 0 30 and 90 degrees.  No ACL laxity with Lachman or anterior drawer.  Has medial greater than lateral joint line tenderness with intact extensor mechanism palpable pedal pulses intact ankle dorsiflexion and no groin pain with internal or external rotation of the leg.  Specialty Comments:  No specialty comments available.  Imaging: XR KNEE 3 VIEW LEFT  Result Date: 01/11/2023 AP lateral merchant radiographs left knee reviewed.  Evidence of prior multiligament reconstruction is noted.  Mild tricompartmental degenerative changes but no acute fracture.  Alignment intact.    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   S/P eye surgery 10/19/2017   Tear of lateral cartilage or meniscus of knee, current 02/07/2012    Family History  Problem Relation Age of Onset   High blood pressure Mother    High Cholesterol Mother    Diabetes type II Mother    High Cholesterol Father    Stroke Father    High blood pressure Father    Emphysema Father    Stroke Maternal Grandmother    High blood pressure Maternal Grandmother    Diabetes Maternal Grandfather    High blood pressure Paternal Grandmother    Multiple sclerosis Sibling    High blood pressure Sibling    Breast cancer Neg Hx     Past Surgical History:  Procedure Laterality Date   ANTERIOR CRUCIATE LIGAMENT REPAIR     BREAST ENHANCEMENT SURGERY     BREAST SURGERY     COLONOSCOPY WITH PROPOFOL N/A 10/03/2022   Procedure: COLONOSCOPY WITH PROPOFOL;  Surgeon: Toney Reil, MD;  Location: [facility] ENDOSCOPY;  Service: Gastroenterology;  Laterality: N/A;   COSMETIC SURGERY     arm lift    EYE SURGERY     FRACTURE SURGERY     MEDIAL COLLATERAL LIGAMENT AND LATERAL COLLATERAL LIGAMENT REPAIR, ELBOW     TONSILLECTOMY     Social History   Occupational History   Not on file  Tobacco Use   Smoking status: Never   Smokeless tobacco: Never  Vaping Use   Vaping status: Never Used  Substance and Sexual Activity   Alcohol use: Never   Drug use: Never   Sexual activity: Not on file

## 2023-01-12 ENCOUNTER — Encounter
Admission: RE | Admit: 2023-01-12 | Discharge: 2023-01-12 | Disposition: A | Discharge status: Home / Self Care | Payer: Federal, State, Local not specified - PPO | Source: Ambulatory Visit | Attending: Obstetrics and Gynecology | Admitting: Obstetrics and Gynecology

## 2023-01-12 ENCOUNTER — Ambulatory Visit
Admission: RE | Admit: 2023-01-12 | Discharge: 2023-01-12 | Disposition: A | Discharge status: Home / Self Care | Payer: Self-pay | Source: Ambulatory Visit | Attending: Family Medicine | Admitting: Family Medicine

## 2023-01-12 ENCOUNTER — Other Ambulatory Visit: Payer: Self-pay | Admitting: *Deleted

## 2023-01-12 VITALS — Ht 63.0 in | Wt 256.8 lb

## 2023-01-12 DIAGNOSIS — Z1231 Encounter for screening mammogram for malignant neoplasm of breast: Secondary | ICD-10-CM

## 2023-01-12 DIAGNOSIS — R600 Localized edema: Secondary | ICD-10-CM | POA: Diagnosis not present

## 2023-01-12 DIAGNOSIS — Z131 Encounter for screening for diabetes mellitus: Secondary | ICD-10-CM | POA: Diagnosis not present

## 2023-01-12 DIAGNOSIS — Z01818 Encounter for other preprocedural examination: Secondary | ICD-10-CM

## 2023-01-12 DIAGNOSIS — Z130 Encounter for screening for diseases of the blood and blood-forming organs and certain disorders involving the immune mechanism: Secondary | ICD-10-CM | POA: Diagnosis not present

## 2023-01-12 DIAGNOSIS — Z13220 Encounter for screening for lipoid disorders: Secondary | ICD-10-CM | POA: Diagnosis not present

## 2023-01-12 HISTORY — DX: Unspecified osteoarthritis, unspecified site: M19.90

## 2023-01-12 HISTORY — DX: Family history of other specified conditions: Z84.89

## 2023-01-12 HISTORY — DX: Leiomyoma of uterus, unspecified: D25.9

## 2023-01-12 HISTORY — DX: Localized edema: R60.0

## 2023-01-12 NOTE — Patient Instructions (Addendum)
Your procedure is scheduled on:01-19-23 Thursday Report to the Registration Desk on the 1st floor of the [facility].Then proceed to the 2nd floor Surgery Desk To find out your arrival time, please call [phone] between 1PM - 3PM on:01-18-23 Wednesday If your arrival time is 6:00 am, do not arrive before that time as the Medical Mall entrance doors do not open until 6:00 am.  REMEMBER: Instructions that are not followed completely may result in serious medical risk, up to and including death; or upon the discretion of your surgeon and anesthesiologist your surgery may need to be rescheduled.  Do not eat food after midnight the night before surgery.  No gum chewing or hard candies.  You may however, drink CLEAR liquids up to 2 hours before you are scheduled to arrive for your surgery. Do not drink anything within 2 hours of your scheduled arrival time.  Clear liquids include: - water  - apple juice without pulp - gatorade (not RED colors) - black coffee or tea (Do NOT add milk or creamers to the coffee or tea) Do NOT drink anything that is not on this list.  One week prior to surgery: Stop Anti-inflammatories (NSAIDS) such as Advil, Aleve, Ibuprofen, Motrin, Naproxen, Naprosyn and Aspirin based products such as Excedrin, Goody's Powder, BC Powder.You may however, take Tylenol if needed for pain up until the day of surgery. Stop ANY OVER THE COUNTER supplements/vitamins NOW (01-12-23) until after surgery (Multivitamin, Ashwagandha, Vitamin B12 and Fish Oil)   Continue taking all prescribed medications   Do NOT take any medication the day of surgery  No Alcohol for 24 hours before or after surgery.  No Smoking including e-cigarettes for 24 hours before surgery.  No chewable tobacco products for at least 6 hours before surgery.  No nicotine patches on the day of surgery.  Do not use any "recreational" drugs for at least a week (preferably 2 weeks) before your surgery.  Please be  advised that the combination of cocaine and anesthesia may have negative outcomes, up to and including death. If you test positive for cocaine, your surgery will be cancelled.  On the morning of surgery brush your teeth with toothpaste and water, you may rinse your mouth with mouthwash if you wish. Do not swallow any toothpaste or mouthwash.  Use CHG Soap as directed on instruction sheet.  Do not wear jewelry, make-up, hairpins, clips or nail polish.  For welded (permanent) jewelry: bracelets, anklets, waist bands, etc.  Please have this removed prior to surgery.  If it is not removed, there is a chance that hospital personnel will need to cut it off on the day of surgery.  Do not wear lotions, powders, or perfumes.   Do not shave body hair from the neck down 48 hours before surgery.  Contact lenses, hearing aids and dentures may not be worn into surgery.  Do not bring valuables to the hospital. [facility] is not responsible for any missing/lost belongings or valuables.   Notify your doctor if there is any change in your medical condition (cold, fever, infection).  Wear comfortable clothing (specific to your surgery type) to the hospital.  After surgery, you can help prevent lung complications by doing breathing exercises.  Take deep breaths and cough every 1-2 hours. Your doctor may order a device called an Incentive Spirometer to help you take deep breaths. When coughing or sneezing, hold a pillow firmly against your incision with both hands. This is called "splinting." Doing this helps protect  your incision. It also decreases belly discomfort.  If you are being admitted to the hospital overnight, leave your suitcase in the car. After surgery it may be brought to your room.  In case of increased patient census, it may be necessary for you, the patient, to continue your postoperative care in the Same Day Surgery department.  If you are being discharged the day of surgery, you will  not be allowed to drive home. You will need a responsible individual to drive you home and stay with you for 24 hours after surgery.   If you are taking public transportation, you will need to have a responsible individual with you.  Please call the Pre-admissions Testing Dept. at [phone] if you have any questions about these instructions.  Surgery Visitation Policy:  Patients having surgery or a procedure may have two visitors.  Children under the age of 81 must have an adult with them who is not the patient.     Preparing for Surgery with CHLORHEXIDINE GLUCONATE (CHG) Soap  Chlorhexidine Gluconate (CHG) Soap  o An antiseptic cleaner that kills germs and bonds with the skin to continue killing germs even after washing  o Used for showering the night before surgery and morning of surgery  Before surgery, you can play an important role by reducing the number of germs on your skin.  CHG (Chlorhexidine gluconate) soap is an antiseptic cleanser which kills germs and bonds with the skin to continue killing germs even after washing.  Please do not use if you have an allergy to CHG or antibacterial soaps. If your skin becomes reddened/irritated stop using the CHG.  1. Shower the NIGHT BEFORE SURGERY and the MORNING OF SURGERY with CHG soap.  2. If you choose to wash your hair, wash your hair first as usual with your normal shampoo.  3. After shampooing, rinse your hair and body thoroughly to remove the shampoo.  4. Use CHG as you would any other liquid soap. You can apply CHG directly to the skin and wash gently with a scrungie or a clean washcloth.  5. Apply the CHG soap to your body only from the neck down. Do not use on open wounds or open sores. Avoid contact with your eyes, ears, mouth, and genitals (private parts). Wash face and genitals (private parts) with your normal soap.  6. Wash thoroughly, paying special attention to the area where your surgery will be  performed.  7. Thoroughly rinse your body with warm water.  8. Do not shower/wash with your normal soap after using and rinsing off the CHG soap.  9. Pat yourself dry with a clean towel.  10. Wear clean pajamas to bed the night before surgery.  12. Place clean sheets on your bed the night of your first shower and do not sleep with pets.  13. Shower again with the CHG soap on the day of surgery prior to arriving at the hospital.  14. Do not apply any deodorants/lotions/powders.  15. Please wear clean clothes to the hospital.  How to Use an Incentive Spirometer An incentive spirometer is a tool that measures how well you are filling your lungs with each breath. Learning to take long, deep breaths using this tool can help you keep your lungs clear and active. This may help to reverse or lessen your chance of developing breathing (pulmonary) problems, especially infection. You may be asked to use a spirometer: After a surgery. If you have a lung problem or a history of  smoking. After a long period of time when you have been unable to move or be active. If the spirometer includes an indicator to show the highest number that you have reached, your health care provider or respiratory therapist will help you set a goal. Keep a log of your progress as told by your health care provider. What are the risks? Breathing too quickly may cause dizziness or cause you to pass out. Take your time so you do not get dizzy or light-headed. If you are in pain, you may need to take pain medicine before doing incentive spirometry. It is harder to take a deep breath if you are having pain. How to use your incentive spirometer  Sit up on the edge of your bed or on a chair. Hold the incentive spirometer so that it is in an upright position. Before you use the spirometer, breathe out normally. Place the mouthpiece in your mouth. Make sure your lips are closed tightly around it. Breathe in slowly and as deeply as  you can through your mouth, causing the piston or the ball to rise toward the top of the chamber. Hold your breath for 3-5 seconds, or for as long as possible. If the spirometer includes a coach indicator, use this to guide you in breathing. Slow down your breathing if the indicator goes above the marked areas. Remove the mouthpiece from your mouth and breathe out normally. The piston or ball will return to the bottom of the chamber. Rest for a few seconds, then repeat the steps 10 or more times. Take your time and take a few normal breaths between deep breaths so that you do not get dizzy or light-headed. Do this every 1-2 hours when you are awake. If the spirometer includes a goal marker to show the highest number you have reached (best effort), use this as a goal to work toward during each repetition. After each set of 10 deep breaths, cough a few times. This will help to make sure that your lungs are clear. If you have an incision on your chest or abdomen from surgery, place a pillow or a rolled-up towel firmly against the incision when you cough. This can help to reduce pain while taking deep breaths and coughing. General tips When you are able to get out of bed: Walk around often. Continue to take deep breaths and cough in order to clear your lungs. Keep using the incentive spirometer until your health care provider says it is okay to stop using it. If you have been in the hospital, you may be told to keep using the spirometer at home. Contact a health care provider if: You are having difficulty using the spirometer. You have trouble using the spirometer as often as instructed. Your pain medicine is not giving enough relief for you to use the spirometer as told. You have a fever. Get help right away if: You develop shortness of breath. You develop a cough with bloody mucus from the lungs. You have fluid or blood coming from an incision site after you cough. Summary An incentive  spirometer is a tool that can help you learn to take long, deep breaths to keep your lungs clear and active. You may be asked to use a spirometer after a surgery, if you have a lung problem or a history of smoking, or if you have been inactive for a long period of time. Use your incentive spirometer as instructed every 1-2 hours while you are awake. If you have  an incision on your chest or abdomen, place a pillow or a rolled-up towel firmly against your incision when you cough. This will help to reduce pain. Get help right away if you have shortness of breath, you cough up bloody mucus, or blood comes from your incision when you cough. This information is not intended to replace advice given to you by your health care provider. Make sure you discuss any questions you have with your health care provider. Document Revised: 07/01/2019 Document Reviewed: 07/01/2019 Elsevier Patient Education  2024 [employer].

## 2023-01-13 ENCOUNTER — Encounter: Payer: Self-pay | Admitting: Family Medicine

## 2023-01-13 ENCOUNTER — Encounter
Admission: RE | Admit: 2023-01-13 | Discharge: 2023-01-13 | Disposition: A | Discharge status: Home / Self Care | Payer: Federal, State, Local not specified - PPO | Source: Ambulatory Visit | Attending: Obstetrics and Gynecology | Admitting: Obstetrics and Gynecology

## 2023-01-13 DIAGNOSIS — Z01818 Encounter for other preprocedural examination: Secondary | ICD-10-CM | POA: Diagnosis not present

## 2023-01-13 DIAGNOSIS — D250 Submucous leiomyoma of uterus: Secondary | ICD-10-CM | POA: Insufficient documentation

## 2023-01-13 DIAGNOSIS — D72829 Elevated white blood cell count, unspecified: Secondary | ICD-10-CM | POA: Insufficient documentation

## 2023-01-13 DIAGNOSIS — Z01812 Encounter for preprocedural laboratory examination: Secondary | ICD-10-CM | POA: Diagnosis not present

## 2023-01-13 LAB — LIPID PANEL
Chol/HDL Ratio: 4 ratio (ref 0.0–4.4)
Cholesterol, Total: 229 mg/dL — ABNORMAL HIGH (ref 100–199)
HDL: 57 mg/dL (ref >39)
LDL Chol Calc (NIH): 160 mg/dL — ABNORMAL HIGH (ref 0–99)
Triglycerides: 70 mg/dL (ref 0–149)
VLDL Cholesterol Cal: 12 mg/dL (ref 5–40)

## 2023-01-13 LAB — CMP14+EGFR
ALT: 10 IU/L (ref 0–32)
AST: 9 IU/L (ref 0–40)
Albumin: 4.3 g/dL (ref 3.8–4.9)
Alkaline Phosphatase: 87 IU/L (ref 44–121)
BUN/Creatinine Ratio: 15 (ref 9–23)
BUN: 11 mg/dL (ref 6–24)
Bilirubin Total: <0.2 mg/dL (ref 0.0–1.2)
CO2: 20 mmol/L (ref 20–29)
Calcium: 9.7 mg/dL (ref 8.7–10.2)
Chloride: 104 mmol/L (ref 96–106)
Creatinine, Ser: 0.75 mg/dL (ref 0.57–1.00)
Globulin, Total: 2.4 g/dL (ref 1.5–4.5)
Glucose: 110 mg/dL — ABNORMAL HIGH (ref 70–99)
Potassium: 4.3 mmol/L (ref 3.5–5.2)
Sodium: 139 mmol/L (ref 134–144)
Total Protein: 6.7 g/dL (ref 6.0–8.5)
eGFR: 96 mL/min/{1.73_m2} (ref >59)

## 2023-01-13 LAB — TSH+T4F+T3FREE
Free T4: 1.37 ng/dL (ref 0.82–1.77)
T3, Free: 2.4 pg/mL (ref 2.0–4.4)
TSH: 0.398 u[IU]/mL — ABNORMAL LOW (ref 0.450–4.500)

## 2023-01-13 LAB — CBC
Hematocrit: 43.7 % (ref 34.0–46.6)
Hemoglobin: 14.2 g/dL (ref 11.1–15.9)
MCH: 28.6 pg (ref 26.6–33.0)
MCHC: 32.5 g/dL (ref 31.5–35.7)
MCV: 88 fL (ref 79–97)
Platelets: 346 10*3/uL (ref 150–450)
RBC: 4.96 x10E6/uL (ref 3.77–5.28)
RDW: 14.3 % (ref 11.7–15.4)
WBC: 19.4 10*3/uL — ABNORMAL HIGH (ref 3.4–10.8)

## 2023-01-13 LAB — HEPATITIS C ANTIBODY: Hep C Virus Ab: NONREACTIVE

## 2023-01-13 LAB — TYPE AND SCREEN
ABO/RH(D): O POS
Antibody Screen: NEGATIVE

## 2023-01-13 LAB — HEMOGLOBIN A1C
Est. average glucose Bld gHb Est-mCnc: 114 mg/dL
Hgb A1c MFr Bld: 5.6 % (ref 4.8–5.6)

## 2023-01-13 LAB — HIV ANTIBODY (ROUTINE TESTING W REFLEX): HIV Screen 4th Generation wRfx: NONREACTIVE

## 2023-01-13 MED ORDER — BUPIVACAINE HCL 0.25 % IJ SOLN
4.0000 mL | INTRAMUSCULAR | Status: AC | PRN
Start: 2023-01-11 — End: 2023-01-11
  Administered 2023-01-11: 4 mL via INTRA_ARTICULAR

## 2023-01-13 MED ORDER — METHYLPREDNISOLONE ACETATE 40 MG/ML IJ SUSP
40.0000 mg | INTRAMUSCULAR | Status: AC | PRN
Start: 2023-01-11 — End: 2023-01-11
  Administered 2023-01-11: 40 mg via INTRA_ARTICULAR

## 2023-01-13 MED ORDER — LIDOCAINE HCL 1 % IJ SOLN
5.0000 mL | INTRAMUSCULAR | Status: AC | PRN
Start: 2023-01-11 — End: 2023-01-11
  Administered 2023-01-11: 5 mL

## 2023-01-13 NOTE — Progress Notes (Signed)
  [facility] Perioperative Services: Pre-Admission/Anesthesia Testing  Abnormal Lab Notification   Date: 01/13/23  Name: Kimberly Good MRN:   [id number]  Re: Abnormal labs noted during PAT appointment   Notified:    Provider Name Provider Role Notification Mode  Simmons-Robinson, Tawanna Cooler, MD Primary Care Provider Routed and/or faxed via Kara Mead, MD OG/GYN Routed and/or faxed via [facility]   ABNORMAL LAB VALUE(S):   Lab Results  Component Value Date   WBC 19.4 (H) 01/12/2023   Clinical Information and Notes:  Kimberly Good is scheduled for a XI ROBOTIC ASSISTED LAPAROSCOPIC HYSTERECTOMY AND SALPINGECTOMY (Bilateral) CYSTOSCOPY on 01/19/2023.  Review of preoperative labs revealed an unexpected leukocytosis; WBC 19.4 (ANC 7300). Patient to be feeling well with no complains of acute illness; no respiratory or urinary symptoms. She has had no recent known contacts. Specifically, no known exposures to anyone suspected/confirmed to have the SARS-CoV-2 virus.   Medication list reviewed. There is nothing that she is chronically taking that would be implicated in the acute WBC elevation. Chart further reviewed. I did see where she was seen in outpatient consult by orthopedics. During her visit, patient received 40 mg methylprednisolone injection on 01/11/2023, which presumably resulted in glucocorticoid induced leukocytosis.   In efforts to mitigate preoperative risk, and to ensure that case is able to proceed as planned, note being sent to interdisciplinary care team for review. If PCP and/or surgeon feel differently regarding the aforementioned lab testing, we can consider further workup prior to upcoming surgery.   Quentin Mulling, MSN, APRN, FNP-C, CEN [facility]  Perioperative Services Nurse Practitioner Phone: [phone] Fax: [phone] 01/13/23 10:25 AM

## 2023-01-16 ENCOUNTER — Encounter: Payer: Self-pay | Admitting: Orthopedic Surgery

## 2023-01-16 DIAGNOSIS — M25562 Pain in left knee: Secondary | ICD-10-CM

## 2023-01-16 NOTE — Telephone Encounter (Signed)
Thanks.  Can you order MRI scan on the affected knee to evaluate for medial pathology to be done in about 3 weeks.  She is having hysterectomy this Thursday.

## 2023-01-16 NOTE — Telephone Encounter (Signed)
Mri ordered

## 2023-01-17 ENCOUNTER — Other Ambulatory Visit: Payer: Self-pay | Admitting: Family Medicine

## 2023-01-17 ENCOUNTER — Telehealth: Payer: Self-pay

## 2023-01-17 DIAGNOSIS — D72829 Elevated white blood cell count, unspecified: Secondary | ICD-10-CM

## 2023-01-17 NOTE — Telephone Encounter (Signed)
Copied from CRM [id number]. Topic: General - Inquiry >> Jan 16, 2023  4:59 PM Marlow Baars wrote: Reason for CRM: The patient called in stating she is scheduled for a hysterectomy on Thursday but her WBC count is high. She said she did have an injection in her knee last Wednesday and gave blood the following day so she is not sure that is what caused it or what. She wants to make sure everything is ok for her surgery because she is ready otherwise. She also has an MRI of her knee being ordered by Dr August Saucer due to the injection not helping. Please assist patient further

## 2023-01-17 NOTE — Telephone Encounter (Signed)
Pt called to report that she will come in for labs after her surgery, she is going to [facility] now because [facility] has sent in orders for her to have labs there prior to her surgery.

## 2023-01-17 NOTE — Telephone Encounter (Signed)
Left vm lab orders in

## 2023-01-18 DIAGNOSIS — D72828 Other elevated white blood cell count: Secondary | ICD-10-CM | POA: Diagnosis not present

## 2023-01-18 MED ORDER — GABAPENTIN 300 MG PO CAPS
300.0000 mg | ORAL_CAPSULE | ORAL | Status: AC
  Administered 2023-01-19: 300 mg via ORAL

## 2023-01-18 MED ORDER — POVIDONE-IODINE 10 % EX SWAB
2.0000 | Freq: Once | CUTANEOUS | Status: AC
  Administered 2023-01-19: 2 via TOPICAL

## 2023-01-18 MED ORDER — FAMOTIDINE 20 MG PO TABS
20.0000 mg | ORAL_TABLET | Freq: Once | ORAL | Status: AC
  Administered 2023-01-19: 20 mg via ORAL

## 2023-01-18 MED ORDER — LACTATED RINGERS IV SOLN: INTRAVENOUS | Status: DC

## 2023-01-18 MED ORDER — ACETAMINOPHEN 500 MG PO TABS
1000.0000 mg | ORAL_TABLET | ORAL | Status: AC
  Administered 2023-01-19: 1000 mg via ORAL

## 2023-01-18 MED ORDER — CELECOXIB 200 MG PO CAPS
400.0000 mg | ORAL_CAPSULE | ORAL | Status: AC
  Administered 2023-01-19: 400 mg via ORAL

## 2023-01-18 MED ORDER — CEFAZOLIN SODIUM-DEXTROSE 2-4 GM/100ML-% IV SOLN
2.0000 g | INTRAVENOUS | Status: AC
  Administered 2023-01-19: 2 g via INTRAVENOUS
  Administered 2023-01-19: 1 g via INTRAVENOUS

## 2023-01-18 MED ORDER — CHLORHEXIDINE GLUCONATE 0.12 % MT SOLN
15.0000 mL | Freq: Once | OROMUCOSAL | Status: AC
  Administered 2023-01-19: 15 mL via OROMUCOSAL

## 2023-01-18 MED ORDER — ORAL CARE MOUTH RINSE: 15.0000 mL | Freq: Once | OROMUCOSAL | Status: AC

## 2023-01-19 ENCOUNTER — Other Ambulatory Visit: Payer: Self-pay

## 2023-01-19 ENCOUNTER — Encounter: Payer: Self-pay | Admitting: Obstetrics and Gynecology

## 2023-01-19 ENCOUNTER — Ambulatory Visit: Payer: Self-pay | Admitting: Urgent Care

## 2023-01-19 ENCOUNTER — Encounter
Admission: RE | Disposition: A | Discharge status: Home / Self Care | Payer: Self-pay | Source: Ambulatory Visit | Attending: Obstetrics and Gynecology

## 2023-01-19 ENCOUNTER — Ambulatory Visit
Admission: RE | Admit: 2023-01-19 | Discharge: 2023-01-19 | Disposition: A | Discharge status: Home / Self Care | Payer: Federal, State, Local not specified - PPO | Source: Ambulatory Visit | Attending: Obstetrics and Gynecology | Admitting: Obstetrics and Gynecology

## 2023-01-19 ENCOUNTER — Ambulatory Visit: Payer: Federal, State, Local not specified - PPO | Admitting: Certified Registered"

## 2023-01-19 DIAGNOSIS — N920 Excessive and frequent menstruation with regular cycle: Secondary | ICD-10-CM | POA: Insufficient documentation

## 2023-01-19 DIAGNOSIS — N80201 Endometriosis of right fallopian tube, unspecified depth: Secondary | ICD-10-CM | POA: Diagnosis not present

## 2023-01-19 DIAGNOSIS — D259 Leiomyoma of uterus, unspecified: Secondary | ICD-10-CM | POA: Diagnosis not present

## 2023-01-19 DIAGNOSIS — N8003 Adenomyosis of the uterus: Secondary | ICD-10-CM | POA: Insufficient documentation

## 2023-01-19 DIAGNOSIS — N946 Dysmenorrhea, unspecified: Secondary | ICD-10-CM | POA: Insufficient documentation

## 2023-01-19 DIAGNOSIS — N736 Female pelvic peritoneal adhesions (postinfective): Secondary | ICD-10-CM | POA: Diagnosis not present

## 2023-01-19 DIAGNOSIS — Z6841 Body Mass Index (BMI) 40.0 and over, adult: Secondary | ICD-10-CM | POA: Insufficient documentation

## 2023-01-19 DIAGNOSIS — D260 Other benign neoplasm of cervix uteri: Secondary | ICD-10-CM | POA: Insufficient documentation

## 2023-01-19 DIAGNOSIS — N838 Other noninflammatory disorders of ovary, fallopian tube and broad ligament: Secondary | ICD-10-CM | POA: Diagnosis not present

## 2023-01-19 DIAGNOSIS — R600 Localized edema: Secondary | ICD-10-CM | POA: Insufficient documentation

## 2023-01-19 DIAGNOSIS — Z79899 Other long term (current) drug therapy: Secondary | ICD-10-CM | POA: Diagnosis not present

## 2023-01-19 DIAGNOSIS — D250 Submucous leiomyoma of uterus: Secondary | ICD-10-CM

## 2023-01-19 DIAGNOSIS — M1990 Unspecified osteoarthritis, unspecified site: Secondary | ICD-10-CM | POA: Diagnosis not present

## 2023-01-19 DIAGNOSIS — N72 Inflammatory disease of cervix uteri: Secondary | ICD-10-CM | POA: Diagnosis not present

## 2023-01-19 DIAGNOSIS — N9489 Other specified conditions associated with female genital organs and menstrual cycle: Secondary | ICD-10-CM | POA: Insufficient documentation

## 2023-01-19 DIAGNOSIS — Z01818 Encounter for other preprocedural examination: Secondary | ICD-10-CM

## 2023-01-19 HISTORY — PX: CYSTOSCOPY: SHX5120

## 2023-01-19 HISTORY — PX: ROBOTIC ASSISTED TOTAL HYSTERECTOMY WITH BILATERAL SALPINGO OOPHERECTOMY: SHX6086

## 2023-01-19 LAB — ABO/RH: ABO/RH(D): O POS

## 2023-01-19 LAB — POCT PREGNANCY, URINE: Preg Test, Ur: NEGATIVE

## 2023-01-19 SURGERY — HYSTERECTOMY, TOTAL, ROBOT-ASSISTED, LAPAROSCOPIC, WITH BILATERAL SALPINGO-OOPHORECTOMY
Anesthesia: General | Site: Bladder | Laterality: Right

## 2023-01-19 MED ORDER — KETOROLAC TROMETHAMINE 0.5 % OP SOLN
1.0000 [drp] | Freq: Four times a day (QID) | OPHTHALMIC | Status: DC
  Administered 2023-01-19: 1 [drp] via OPHTHALMIC
  Filled 2023-01-19: qty 3

## 2023-01-19 MED ORDER — ONDANSETRON HCL 4 MG/2ML IJ SOLN
INTRAMUSCULAR | Status: DC | PRN
  Administered 2023-01-19 (×2): 4 mg via INTRAVENOUS

## 2023-01-19 MED ORDER — EPHEDRINE SULFATE (PRESSORS) 50 MG/ML IJ SOLN
INTRAMUSCULAR | Status: DC | PRN
  Administered 2023-01-19 (×2): 5 mg via INTRAVENOUS

## 2023-01-19 MED ORDER — FENTANYL CITRATE (PF) 100 MCG/2ML IJ SOLN
INTRAMUSCULAR | Status: AC
  Filled 2023-01-19: qty 2

## 2023-01-19 MED ORDER — CEFAZOLIN SODIUM-DEXTROSE 2-4 GM/100ML-% IV SOLN
INTRAVENOUS | Status: AC
  Filled 2023-01-19: qty 100

## 2023-01-19 MED ORDER — LACTATED RINGERS IR SOLN
Status: DC | PRN
Start: 2023-01-19 — End: 2023-01-19
  Administered 2023-01-19: 1

## 2023-01-19 MED ORDER — OXYCODONE HCL 5 MG PO TABS
5.0000 mg | ORAL_TABLET | Freq: Once | ORAL | Status: AC
  Administered 2023-01-19: 5 mg via ORAL

## 2023-01-19 MED ORDER — PHENYLEPHRINE HCL-NACL 20-0.9 MG/250ML-% IV SOLN
INTRAVENOUS | Status: DC | PRN
  Administered 2023-01-19: 15 ug/min via INTRAVENOUS

## 2023-01-19 MED ORDER — SODIUM CHLORIDE 0.9 % IV SOLN
INTRAVENOUS | Status: DC | PRN
Start: 2023-01-19 — End: 2023-01-19

## 2023-01-19 MED ORDER — KETAMINE HCL 50 MG/5ML IJ SOSY
PREFILLED_SYRINGE | INTRAMUSCULAR | Status: AC
  Filled 2023-01-19: qty 5

## 2023-01-19 MED ORDER — ACETAMINOPHEN EXTRA STRENGTH 500 MG PO TABS: 1000.0000 mg | ORAL_TABLET | Freq: Four times a day (QID) | ORAL | 0 refills | Status: AC

## 2023-01-19 MED ORDER — ROCURONIUM BROMIDE 100 MG/10ML IV SOLN
INTRAVENOUS | Status: DC | PRN
  Administered 2023-01-19: 30 mg via INTRAVENOUS
  Administered 2023-01-19: 50 mg via INTRAVENOUS
  Administered 2023-01-19: 30 mg via INTRAVENOUS
  Administered 2023-01-19: 20 mg via INTRAVENOUS

## 2023-01-19 MED ORDER — IBUPROFEN 800 MG PO TABS: 800.0000 mg | ORAL_TABLET | Freq: Three times a day (TID) | ORAL | 1 refills | Status: AC

## 2023-01-19 MED ORDER — MIDAZOLAM HCL 2 MG/2ML IJ SOLN
INTRAMUSCULAR | Status: DC | PRN
  Administered 2023-01-19: 2 mg via INTRAVENOUS

## 2023-01-19 MED ORDER — DROPERIDOL 2.5 MG/ML IJ SOLN
INTRAMUSCULAR | Status: AC
  Filled 2023-01-19: qty 2

## 2023-01-19 MED ORDER — DEXMEDETOMIDINE HCL IN NACL 200 MCG/50ML IV SOLN
INTRAVENOUS | Status: DC | PRN
Start: 2023-01-19 — End: 2023-01-19
  Administered 2023-01-19: 8 ug via INTRAVENOUS

## 2023-01-19 MED ORDER — PHENYLEPHRINE 80 MCG/ML (10ML) SYRINGE FOR IV PUSH (FOR BLOOD PRESSURE SUPPORT)
PREFILLED_SYRINGE | INTRAVENOUS | Status: DC | PRN
  Administered 2023-01-19 (×4): 160 ug via INTRAVENOUS

## 2023-01-19 MED ORDER — LIDOCAINE HCL (CARDIAC) PF 100 MG/5ML IV SOSY
PREFILLED_SYRINGE | INTRAVENOUS | Status: DC | PRN
  Administered 2023-01-19: 100 mg via INTRAVENOUS

## 2023-01-19 MED ORDER — LACTATED RINGERS IV SOLN: INTRAVENOUS | Status: DC

## 2023-01-19 MED ORDER — CHLORHEXIDINE GLUCONATE 0.12 % MT SOLN
OROMUCOSAL | Status: AC
  Filled 2023-01-19: qty 15

## 2023-01-19 MED ORDER — SUCCINYLCHOLINE CHLORIDE 200 MG/10ML IV SOSY
PREFILLED_SYRINGE | INTRAVENOUS | Status: DC | PRN
  Administered 2023-01-19: 100 mg via INTRAVENOUS

## 2023-01-19 MED ORDER — 0.9 % SODIUM CHLORIDE (POUR BTL) OPTIME
TOPICAL | Status: DC | PRN
  Administered 2023-01-19: 500 mL

## 2023-01-19 MED ORDER — BUPIVACAINE HCL (PF) 0.5 % IJ SOLN
INTRAMUSCULAR | Status: AC
  Filled 2023-01-19: qty 30

## 2023-01-19 MED ORDER — HYDROMORPHONE HCL 1 MG/ML IJ SOLN
INTRAMUSCULAR | Status: AC
  Filled 2023-01-19: qty 1

## 2023-01-19 MED ORDER — FENTANYL CITRATE (PF) 100 MCG/2ML IJ SOLN: 25.0000 ug | INTRAMUSCULAR | Status: DC | PRN

## 2023-01-19 MED ORDER — DOCUSATE SODIUM 100 MG PO CAPS: 100.0000 mg | ORAL_CAPSULE | Freq: Two times a day (BID) | ORAL | 0 refills | Status: DC

## 2023-01-19 MED ORDER — METHYLENE BLUE (ANTIDOTE) 1 % IV SOLN
INTRAVENOUS | Status: AC
  Filled 2023-01-19: qty 10

## 2023-01-19 MED ORDER — OXYCODONE HCL 5 MG PO TABS
ORAL_TABLET | ORAL | Status: AC
  Filled 2023-01-19: qty 1

## 2023-01-19 MED ORDER — PROPOFOL 10 MG/ML IV BOLUS
INTRAVENOUS | Status: DC | PRN
  Administered 2023-01-19: 200 mg via INTRAVENOUS

## 2023-01-19 MED ORDER — KETAMINE HCL 10 MG/ML IJ SOLN
INTRAMUSCULAR | Status: DC | PRN
Start: 2023-01-19 — End: 2023-01-19
  Administered 2023-01-19: 20 mg via INTRAVENOUS
  Administered 2023-01-19: 30 mg via INTRAVENOUS

## 2023-01-19 MED ORDER — DROPERIDOL 2.5 MG/ML IJ SOLN: 0.6250 mg | Freq: Once | INTRAMUSCULAR | Status: DC | PRN

## 2023-01-19 MED ORDER — GLYCOPYRROLATE 0.2 MG/ML IJ SOLN
INTRAMUSCULAR | Status: DC | PRN
  Administered 2023-01-19: .2 mg via INTRAVENOUS

## 2023-01-19 MED ORDER — HYDROMORPHONE HCL 1 MG/ML IJ SOLN
INTRAMUSCULAR | Status: DC | PRN
  Administered 2023-01-19: 1 mg via INTRAVENOUS

## 2023-01-19 MED ORDER — SILVER NITRATE-POT NITRATE 75-25 % EX MISC
CUTANEOUS | Status: AC
  Filled 2023-01-19: qty 10

## 2023-01-19 MED ORDER — SODIUM CHLORIDE 0.9 % IR SOLN
Status: DC | PRN
  Administered 2023-01-19: 1

## 2023-01-19 MED ORDER — BUPIVACAINE HCL (PF) 0.5 % IJ SOLN
INTRAMUSCULAR | Status: DC | PRN
  Administered 2023-01-19: 15 mL

## 2023-01-19 MED ORDER — CELECOXIB 200 MG PO CAPS
ORAL_CAPSULE | ORAL | Status: AC
  Filled 2023-01-19: qty 2

## 2023-01-19 MED ORDER — GABAPENTIN 300 MG PO CAPS
ORAL_CAPSULE | ORAL | Status: AC
  Filled 2023-01-19: qty 1

## 2023-01-19 MED ORDER — SUGAMMADEX SODIUM 200 MG/2ML IV SOLN
INTRAVENOUS | Status: DC | PRN
  Administered 2023-01-19: 200 mg via INTRAVENOUS

## 2023-01-19 MED ORDER — FENTANYL CITRATE (PF) 100 MCG/2ML IJ SOLN
INTRAMUSCULAR | Status: DC | PRN
  Administered 2023-01-19 (×2): 50 ug via INTRAVENOUS

## 2023-01-19 MED ORDER — ACETAMINOPHEN 500 MG PO TABS
ORAL_TABLET | ORAL | Status: AC
  Filled 2023-01-19: qty 2

## 2023-01-19 MED ORDER — BSS IO SOLN
15.0000 mL | Freq: Once | INTRAOCULAR | Status: AC
  Administered 2023-01-19: 15 mL
  Filled 2023-01-19: qty 15

## 2023-01-19 MED ORDER — OXYCODONE HCL 5 MG PO TABS
5.0000 mg | ORAL_TABLET | ORAL | 0 refills | Status: DC | PRN
Start: 2023-01-19 — End: 2024-01-10

## 2023-01-19 MED ORDER — STERILE WATER FOR IRRIGATION IR SOLN
Status: DC | PRN
  Administered 2023-01-19: 120 mL via INTRAVESICAL

## 2023-01-19 MED ORDER — FAMOTIDINE 20 MG PO TABS
ORAL_TABLET | ORAL | Status: AC
  Filled 2023-01-19: qty 1

## 2023-01-19 MED ORDER — MIDAZOLAM HCL 2 MG/2ML IJ SOLN
INTRAMUSCULAR | Status: AC
  Filled 2023-01-19: qty 2

## 2023-01-19 SURGICAL SUPPLY — 65 items
APL SRG 38 LTWT LNG FL B (MISCELLANEOUS)
APPLICATOR ARISTA FLEXITIP XL (MISCELLANEOUS) IMPLANT
BAG DRN RND TRDRP ANRFLXCHMBR (UROLOGICAL SUPPLIES) ×3
BAG URINE DRAIN 2000ML AR STRL (UROLOGICAL SUPPLIES) ×3 IMPLANT
BLADE SURG 15 STRL LF DISP TIS (BLADE) ×1 IMPLANT
BLADE SURG 15 STRL SS (BLADE) ×3
BLADE SURG SZ11 CARB STEEL (BLADE) ×3 IMPLANT
CANNULA CAP OBTURATR AIRSEAL 8 (CAP) ×3 IMPLANT
CATH FOLEY 2WAY 5CC 16FR (CATHETERS) ×3
CATH URTH 16FR FL 2W BLN LF (CATHETERS) ×3 IMPLANT
COUNTER NEEDLE 20/40 LG (NEEDLE) ×3 IMPLANT
COVER TIP SHEARS 8 DVNC (MISCELLANEOUS) ×3 IMPLANT
DRAPE ARM DVNC X/XI (DISPOSABLE) ×12 IMPLANT
DRAPE COLUMN DVNC XI (DISPOSABLE) ×3 IMPLANT
DRAPE ROBOT W/ LEGGING 30X125 (DRAPES) ×3 IMPLANT
DRAPE SHEET LG 3/4 BI-LAMINATE (DRAPES) ×3 IMPLANT
DRSG TEGADERM 2-3/8X2-3/4 SM (GAUZE/BANDAGES/DRESSINGS) ×5 IMPLANT
ELECT REM PT RETURN 9FT ADLT (ELECTROSURGICAL) ×3
ELECTRODE REM PT RTRN 9FT ADLT (ELECTROSURGICAL) ×3 IMPLANT
FORCEPS BPLR FENES DVNC XI (FORCEP) ×3 IMPLANT
FORCEPS TENACULUM DVNC XI (FORCEP) ×1 IMPLANT
GAUZE 4X4 16PLY ~~LOC~~+RFID DBL (SPONGE) ×3 IMPLANT
GAUZE SPONGE 2X2 STRL 8-PLY (GAUZE/BANDAGES/DRESSINGS) ×5 IMPLANT
GLOVE BIO SURGEON STRL SZ7 (GLOVE) ×12 IMPLANT
GLOVE INDICATOR 7.5 STRL GRN (GLOVE) ×12 IMPLANT
GOWN STRL REUS W/ TWL LRG LVL3 (GOWN DISPOSABLE) ×18 IMPLANT
GOWN STRL REUS W/TWL LRG LVL3 (GOWN DISPOSABLE) ×18
HEMOSTAT ARISTA ABSORB 3G PWDR (HEMOSTASIS) IMPLANT
IRRIGATION STRYKERFLOW (MISCELLANEOUS) ×1 IMPLANT
IRRIGATOR STRYKERFLOW (MISCELLANEOUS) ×3
IV LACTATED RINGERS 1000ML (IV SOLUTION) ×3 IMPLANT
IV NS 1000ML (IV SOLUTION) ×3
IV NS 1000ML BAXH (IV SOLUTION) ×1 IMPLANT
KIT PINK PAD W/HEAD ARE REST (MISCELLANEOUS) ×3
KIT PINK PAD W/HEAD ARM REST (MISCELLANEOUS) ×3 IMPLANT
LABEL OR SOLS (LABEL) ×3 IMPLANT
MANIFOLD NEPTUNE II (INSTRUMENTS) ×3 IMPLANT
MANIPULATOR VCARE LG CRV RETR (MISCELLANEOUS) ×1 IMPLANT
NDL DRIVE SUT CUT DVNC (INSTRUMENTS) ×2 IMPLANT
NEEDLE DRIVE SUT CUT DVNC (INSTRUMENTS) ×3 IMPLANT
NS IRRIG 500ML POUR BTL (IV SOLUTION) ×3 IMPLANT
OBTURATOR OPTICAL STND 8 DVNC (TROCAR) ×3
OBTURATOR OPTICALSTD 8 DVNC (TROCAR) ×3 IMPLANT
OCCLUDER COLPOPNEUMO (BALLOONS) ×3 IMPLANT
PACK GYN LAPAROSCOPIC (MISCELLANEOUS) ×3 IMPLANT
PAD OB MATERNITY 4.3X12.25 (PERSONAL CARE ITEMS) ×3 IMPLANT
PAD PREP OB/GYN DISP 24X41 (PERSONAL CARE ITEMS) ×3 IMPLANT
SCISSORS MNPLR CVD DVNC XI (INSTRUMENTS) ×3 IMPLANT
SCRUB CHG 4% DYNA-HEX 4OZ (MISCELLANEOUS) ×3 IMPLANT
SEAL UNIV 5-12 XI (MISCELLANEOUS) ×9 IMPLANT
SEALER VESSEL EXT DVNC XI (MISCELLANEOUS) ×3 IMPLANT
SET CYSTO W/LG BORE CLAMP LF (SET/KITS/TRAYS/PACK) ×3 IMPLANT
SET TUBE FILTERED XL AIRSEAL (SET/KITS/TRAYS/PACK) ×3 IMPLANT
SOL ELECTROSURG ANTI STICK (MISCELLANEOUS) ×3
SOLUTION ELECTROSURG ANTI STCK (MISCELLANEOUS) ×3 IMPLANT
SURGILUBE 2OZ TUBE FLIPTOP (MISCELLANEOUS) ×3 IMPLANT
SUT MNCRL 4-0 (SUTURE) ×6
SUT MNCRL 4-0 27XMFL (SUTURE) ×6
SUT VIC AB 0 CT2 27 (SUTURE) ×6 IMPLANT
SUT VLOC 90 2/L VL 12 GS22 (SUTURE) ×5 IMPLANT
SUTURE MNCRL 4-0 27XMF (SUTURE) ×4 IMPLANT
SYR 20ML LL LF (SYRINGE) ×1 IMPLANT
SYR 50ML LL SCALE MARK (SYRINGE) ×1 IMPLANT
SYR TOOMEY IRRIG 70ML (MISCELLANEOUS) ×3
SYRINGE TOOMEY IRRIG 70ML (MISCELLANEOUS) ×1 IMPLANT

## 2023-01-19 NOTE — Op Note (Signed)
Kimberly Good PROCEDURE DATE: 01/19/2023  PREOPERATIVE DIAGNOSIS: Dysmenorrhea, fibroids POSTOPERATIVE DIAGNOSIS: The same PROCEDURE:  XI ROBOTIC ASSISTED TOTAL HYSTERECTOMY WITH BILATERAL SALPINGECTOMY AND RIGHT OOPHORECTOMY:  CYSTOSCOPY: 52000 (CPT)  SURGEON:  Dr. Christeen Douglas, MD ASSISTANT: RHFA Anesthesiologist:  Anesthesiologist: Lenard Simmer, MD CRNA: Jeanine Luz, CRNA; Fletcher-Harrison, Soil scientist, CRNA  INDICATIONS: 52 y.o. F  here for definitive surgical management secondary to the indications listed under preoperative diagnoses; please see preoperative note for further details.  Risks of surgery were discussed with the patient including but not limited to: bleeding which may require transfusion or reoperation; infection which may require antibiotics; injury to bowel, bladder, ureters or other surrounding organs; need for additional procedures; thromboembolic phenomenon, incisional problems and other postoperative/anesthesia complications. Written informed consent was obtained.    FINDINGS:   Abdomen with well-healed abdominoplasty scars and neo-umbilicus.  External genitalia, vaginal canal negative for lesions. Large fibroid prolasping from the external os and filling the endometrial canal entirely. This brought aberrant  blood flow to the left cervix and pelvic sidewall and extended the case.  The uterus was small and mobile and deviated to the left of the pelvis. Left fallopian tube exiting the fundus and adnexa outside the pelvis. Right ovary with endometriosis and a large cyst, removed entirely.  Intraoperative findings revealed a normal upper abdomen including bowel, liver, diaphragmatic surfaces, stomach, and omentum.   Bilaterally double ureters, all four peristalsing during the case.  Stage II or great endometriosis throughout the pelvis and bladder, large blebs along the dome of the bladder.   Appendix wnl but adherent, due to endo scarring. This was removed  sharply.  Difficult case due to distorted anatomy at the cervix, modifier-22 as case more than doubled in time   ANESTHESIA:    General INTRAVENOUS FLUIDS:1700  ml ESTIMATED BLOOD LOSS:20 ml URINE OUTPUT: 400 ml  SPECIMENS: Uterus, cervix, bilateral fallopian tubes and right ovaries COMPLICATIONS: None immediate  RATLH/BS:  PROCEDURE IN DETAIL: After informed consent was obtained, the patient was taken to the operating room where general anesthesia was obtained without difficulty. The patient was positioned in the dorsal lithotomy position in [place] stirrups and her arms were carefully tucked at her sides and the usual precautions were taken. Deep Trendelenburg (20-25 deg) was established to confirm that she does not shift on the table.  She was prepped and draped in normal sterile fashion.  Time-out was performed and a Foley catheter was placed into the bladder. A standard VCare uterine manipulator was unable to be placed, and a wrapped EEA sizer used instead. However, the anatomy precluded visualization. Preoperative prophylactic antibiotics were given through her iv, 3g Ancef.  After infiltration of local anesthetic at the proposed trocar sites, an 5 mm incision was created at the Palmer's point, and an AirSeal 5mm was placed under direct visualization, after confirmation of OG tube working well. Pneumoperitoneum was created to a pressure of 15 mm Hg. The camera was placed and the abdomin surveyed, noting intact bowel below the site of entry. A survey of the pelvis and upper abdomen revealed the above findings. Two right and one left lateral 8-mm robotic ports were placed under direct visualization. The camera was moved to midline.  The patient was placed in deepTrendelenburg and the bowel was displaced up into the upper abdomen. The robot was left side docked. The instruments were placed under direct visualization.   [place] scarring removed.  The 4 ureters were identified bilaterally  coursing outside of the operative field. Round ligaments were  divided on each side with the EndoShears and the retroperitoneal space was opened bilaterally. The posterior leaflet of the broad was taken down to the level of the IP ligament. The anterior leaflet of the broad ligament was carefully taken down to the midline. Bladder backfilled for left visualization. Uterus sinister-rotated. A bladder flap was created and the bladder was dissected down off the lower uterine segment and cervix using endoshears and electrocautery.     Ovariolysis was performed on the left and the ovary was dissected medially with care to avoid the ureter.  The infundibulopelvic ligaments were skeletonized, sealed and divided. The peritoneum was taken down to the level of the internal os, and the uterine arteries skeletonized.  The right Fallopian tube was divided from the ovaries, and care taken to hemostatically transect the utero-ovarian ligament.   A colpotomy was performed circumferentially along the EEA sizer with monopolar electrocautery and the cervix was incised from the vagina using the laparoscopic scissors. The specimen was removed through the vagina.  A pneumo balloon was placed in the vagina and the vaginal cuff was then closed in a running continuous fashion using the  0 V-Lock suture with careful attention to include the vaginal cuff angles, the uterosacral ligaments and the vaginal mucosa within the closure.  Hemostasis was secured with intraabdominal pressure and review of all surgical sites. The intraperitoneal pressure was dropped, and all planes of dissection, vascular pedicles and the vaginal cuff were found to be hemostatic.  The robot was undocked.   Attention was turned to the bladder and cystoscopy showed vigorous bilateral ureteral jets.  No stitches were visualized in the bladder during cystoscopy.  The lateral trocars were removed under visualization.  The CO2 gas was released and several  deep breaths given to remove any remaining CO2 from the peritoneal cavity.  The skin incisions were closed with 4-0 Monocryl subcuticular stitch and pressure dressing.    Anesthesia was reversed without difficulty.  The patient tolerated the procedure well.  Sponge, lap and needle counts were correct x2.  The patient was taken to recovery room in excellent condition.

## 2023-01-19 NOTE — Anesthesia Procedure Notes (Signed)
Procedure Name: Intubation Date/Time: 01/19/2023 9:30 AM  Performed by: Mohammed Kindle, CRNAPre-anesthesia Checklist: Patient identified, Emergency Drugs available, Suction available and Patient being monitored Patient Re-evaluated:Patient Re-evaluated prior to induction Oxygen Delivery Method: Circle system utilized Preoxygenation: Pre-oxygenation with 100% oxygen Induction Type: IV induction Ventilation: Mask ventilation without difficulty Laryngoscope Size: McGraph and 3 Grade View: Grade I Tube type: Oral Tube size: 7.0 mm Number of attempts: 1 Airway Equipment and Method: Stylet Placement Confirmation: ETT inserted through vocal cords under direct vision, positive ETCO2, breath sounds checked- equal and bilateral and CO2 detector Secured at: 21 cm Tube secured with: Tape Dental Injury: Teeth and Oropharynx as per pre-operative assessment

## 2023-01-19 NOTE — Anesthesia Preprocedure Evaluation (Signed)
Anesthesia Evaluation  Patient identified by MRN, date of birth, ID band Patient awake    Reviewed: Allergy & Precautions, NPO status , Patient's Chart, lab work & pertinent test results  History of Anesthesia Complications Negative for: history of anesthetic complications  Airway Mallampati: II  TM Distance: >3 FB Neck ROM: full    Dental no notable dental hx.    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: S/P eye surgery 02/07/2012: Tear of lateral cartilage or meniscus of knee, current  Past Surgical History: No date: ANTERIOR CRUCIATE LIGAMENT REPAIR No date: BREAST ENHANCEMENT SURGERY No date: BREAST SURGERY No date: COSMETIC SURGERY     Comment:  arm lift  No date: EYE SURGERY No date: FRACTURE SURGERY No date: MEDIAL COLLATERAL LIGAMENT AND LATERAL COLLATERAL LIGAMENT  REPAIR, ELBOW No date: TONSILLECTOMY     Reproductive/Obstetrics negative OB ROS                             Anesthesia Physical Anesthesia Plan  ASA: 3  Anesthesia Plan: General   Post-op Pain Management: Minimal or no pain anticipated   Induction: Intravenous  PONV Risk Score and Plan: Ondansetron, Dexamethasone, Midazolam and Treatment may vary due to age or medical condition  Airway Management Planned: Oral ETT  Additional Equipment:    Intra-op Plan:   Post-operative Plan: Extubation in OR  Informed Consent: I have reviewed the patients History and Physical, chart, labs and discussed the procedure including the risks, benefits and alternatives for the proposed anesthesia with the patient or authorized representative who has indicated his/her understanding and acceptance.     Dental Advisory Given  Plan Discussed with: Anesthesiologist, CRNA and Surgeon  Anesthesia Plan Comments: (Patient consented for risks of anesthesia including but not limited to:  - adverse reactions to medications - risk of airway placement if required - damage to eyes, teeth, lips or other oral mucosa - nerve damage due to positioning  - sore throat or hoarseness - Damage to heart, brain, nerves, lungs, other parts of body or loss of life  Patient voiced understanding.)       Anesthesia Quick Evaluation

## 2023-01-19 NOTE — Anesthesia Postprocedure Evaluation (Signed)
Anesthesia Post Note  Patient: Kimberly Good  Procedure(s) Performed: XI ROBOTIC ASSISTED TOTAL HYSTERECTOMY WITH BILATERAL SALPINGECTOMY AND RIGHT OOPHORECTOMY (Right: Abdomen) CYSTOSCOPY (Bladder)  Patient location during evaluation: PACU Anesthesia Type: General Level of consciousness: awake and alert Pain management: pain level controlled Vital Signs Assessment: post-procedure vital signs reviewed and stable Respiratory status: spontaneous breathing, nonlabored ventilation and respiratory function stable Cardiovascular status: blood pressure returned to baseline and stable Postop Assessment: no apparent nausea or vomiting Anesthetic complications: yes Comments: Left corneal abrasion. Treated with ketorolac drops after irrigation with discharge instructions to follow up with ophthalmology in 24hr if needed for persistent pain   Encounter Notable Events  Notable Event Outcome Phase Comment  Corneal abrasion  Postprocedure, before discharge Left eye. Treated with irrigation and ketorolac drops     Last Vitals:  Vitals:   01/19/23 1618 01/19/23 1705  BP: 127/68 130/78  Pulse: 84 80  Resp: 16 18  Temp: (!) 36.2 C   SpO2: 96% 98%    Last Pain:  Vitals:   01/19/23 1618  TempSrc: Temporal  PainSc:                  [name]

## 2023-01-19 NOTE — Discharge Instructions (Addendum)

## 2023-01-19 NOTE — Transfer of Care (Signed)
Immediate Anesthesia Transfer of Care Note  Patient: Kimberly Good  Procedure(s) Performed: XI ROBOTIC ASSISTED TOTAL HYSTERECTOMY WITH BILATERAL SALPINGECTOMY AND RIGHT OOPHORECTOMY (Right: Abdomen) CYSTOSCOPY (Bladder)  Patient Location: PACU  Anesthesia Type:General  Level of Consciousness: awake, drowsy, and patient cooperative  Airway & Oxygen Therapy: Patient Spontanous Breathing and Patient connected to face mask oxygen  Post-op Assessment: Report given to RN and Post -op Vital signs reviewed and stable  Post vital signs: Reviewed and stable  Last Vitals:  Vitals Value Taken Time  BP 119/61 01/19/23 1334  Temp 36.5 C 01/19/23 1334  Pulse 85 01/19/23 1345  Resp 14 01/19/23 1345  SpO2 100 % 01/19/23 1345  Vitals shown include unfiled device data.  Last Pain:  Vitals:   01/19/23 1334  TempSrc:   PainSc: Asleep         Complications: No notable events documented.

## 2023-01-19 NOTE — Progress Notes (Signed)
Patient c/o left eye felt scratchy. Dr. Laural Benes notified new orders given.

## 2023-01-19 NOTE — Interval H&P Note (Signed)
History and Physical Interval Note:  01/19/2023 9:07 AM  Kimberly Good  has presented today for surgery, with the diagnosis of fibroid-prolapsed, menorrhagia, dysmenorrhia.  The various methods of treatment have been discussed with the patient and family. After consideration of risks, benefits and other options for treatment, the patient has consented to  Procedure(s) with comments: XI ROBOTIC ASSISTED LAPAROSCOPIC HYSTERECTOMY AND SALPINGECTOMY (Bilateral) - RNFA NEEDED UNTIL [place] CAN ASSIST CYSTOSCOPY (N/A) as a surgical intervention.  The patient's history has been reviewed, patient examined, no change in status, stable for surgery.  I have reviewed the patient's chart and labs.  Questions were answered to the patient's satisfaction.     Kimberly Good

## 2023-01-20 ENCOUNTER — Encounter: Payer: Self-pay | Admitting: Obstetrics and Gynecology

## 2023-01-23 LAB — SURGICAL PATHOLOGY

## 2023-02-07 DIAGNOSIS — N898 Other specified noninflammatory disorders of vagina: Secondary | ICD-10-CM | POA: Diagnosis not present

## 2023-02-07 DIAGNOSIS — N941 Unspecified dyspareunia: Secondary | ICD-10-CM | POA: Diagnosis not present

## 2023-02-07 DIAGNOSIS — Z09 Encounter for follow-up examination after completed treatment for conditions other than malignant neoplasm: Secondary | ICD-10-CM | POA: Diagnosis not present

## 2023-02-12 ENCOUNTER — Other Ambulatory Visit: Payer: Federal, State, Local not specified - PPO

## 2023-02-22 ENCOUNTER — Ambulatory Visit: Payer: Federal, State, Local not specified - PPO | Admitting: Orthopedic Surgery

## 2023-02-25 ENCOUNTER — Encounter: Payer: Self-pay | Admitting: Orthopedic Surgery

## 2023-02-25 DIAGNOSIS — M25562 Pain in left knee: Secondary | ICD-10-CM

## 2023-02-25 DIAGNOSIS — M25462 Effusion, left knee: Secondary | ICD-10-CM

## 2023-02-26 ENCOUNTER — Ambulatory Visit
Admission: RE | Admit: 2023-02-26 | Discharge: 2023-02-26 | Disposition: A | Discharge status: Home / Self Care | Payer: Federal, State, Local not specified - PPO | Source: Ambulatory Visit | Attending: Orthopedic Surgery | Admitting: Orthopedic Surgery

## 2023-02-26 DIAGNOSIS — M25462 Effusion, left knee: Secondary | ICD-10-CM | POA: Diagnosis not present

## 2023-02-26 DIAGNOSIS — M25562 Pain in left knee: Secondary | ICD-10-CM

## 2023-02-26 DIAGNOSIS — M1712 Unilateral primary osteoarthritis, left knee: Secondary | ICD-10-CM | POA: Diagnosis not present

## 2023-03-06 ENCOUNTER — Telehealth: Payer: Self-pay

## 2023-03-06 ENCOUNTER — Ambulatory Visit: Payer: Federal, State, Local not specified - PPO | Admitting: Orthopedic Surgery

## 2023-03-06 ENCOUNTER — Encounter: Payer: Self-pay | Admitting: Orthopedic Surgery

## 2023-03-06 VITALS — Ht 63.0 in | Wt 256.0 lb

## 2023-03-06 DIAGNOSIS — M25562 Pain in left knee: Secondary | ICD-10-CM | POA: Diagnosis not present

## 2023-03-06 DIAGNOSIS — M25462 Effusion, left knee: Secondary | ICD-10-CM

## 2023-03-06 NOTE — Telephone Encounter (Signed)
Auth needed for left knee gel 

## 2023-03-06 NOTE — Progress Notes (Unsigned)
Office Visit Note   Patient: Kimberly Good           Date of Birth: November 19, 1970           MRN: [id number] Visit Date: 03/06/2023 Requested by: Ronnald Ramp, MD [address] [place],  [place] [address] PCP: Ronnald Ramp, MD  Subjective: Chief Complaint  Patient presents with   Left Knee - Follow-up    HPI: Kimberly Good is a 52 y.o. female who presents to the office reporting left knee pain.  Since she was last seen she has had an MRI scan..  My review of the scan shows significant cartilage loss in the medial compartment primarily on the femoral side.  Prior meniscectomy also has been performed with significant loss of meniscal volume along the order of 80 to 90% in some locations.  The ACL graft appears intact and the MCL graft also appears intact.  Lateral compartment articular cartilage and menisci spared.  Patellofemoral arthritis also present.  Overall she states she is not really any better.  Pain is severe at night.  Wakes her from sleep.  Has to sleep with support.  Since she was last seen she has had a hysterectomy and she is doing well with that.  She did have an injection in the knee which only helped her for several days.  Denies any groin pain.  Hurts for her to actually get on her knees where she has "inside pain".  She is retired.  Does not do any sports.  States she cannot keep on living with this quality of life regarding her knee.              ROS: All systems reviewed are negative as they relate to the chief complaint within the history of present illness.  Patient denies fevers or chills.  Assessment & Plan: Visit Diagnoses:  1. Left knee pain, unspecified chronicity   2. Effusion, left knee     Plan: Impression is stable grafts with the MCL and ACL.  She does have some degeneration and volume loss of the meniscus without any discrete "tear" in the meniscus.  Arthritis is present also in that medial compartment.  She also  has significant patellofemoral arthritis.  Could consider gel injection.  Total knee replacement not a great option for her at this time but may be in her future at some point later.  She is going to consider her options and let me know what she wants to do.  Official MRI reading not out yet but I do not see anything arthroscopically treatable that would give her more than a 50-50 chance of improvement in terms of debridement in that medial compartment.  Follow-Up Instructions: No follow-ups on file.   Orders:  No orders of the defined types were placed in this encounter.  No orders of the defined types were placed in this encounter.     Procedures: No procedures performed   Clinical Data: No additional findings.  Objective: Vital Signs: Ht 5\' 3"  (1.6 m)   Wt 256 lb (116.1 kg)   BMI 45.35 kg/m   Physical Exam:  Constitutional: Patient appears well-developed HEENT:  Head: Normocephalic Eyes:EOM are normal Neck: Normal range of motion Cardiovascular: Normal rate Pulmonary/chest: Effort normal Neurologic: Patient is alert Skin: Skin is warm Psychiatric: Patient has normal mood and affect  Ortho Exam: Ortho exam demonstrates trace effusion in the left knee.  Collateral cruciate ligaments are stable.  She has nearly full extension and flexion  to about 115.  Extensor mechanism intact.  No groin pain with internal/external rotation of the leg.  No nerve root tension signs.  Specialty Comments:  No specialty comments available.  Imaging: No results found.   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   S/P eye surgery 10/19/2017   Tear of lateral cartilage or meniscus of knee, current 02/07/2012    Family History  Problem Relation Age of Onset   High blood pressure Mother    High Cholesterol Mother    Diabetes type II Mother    High Cholesterol Father    Stroke Father    High blood pressure Father    Emphysema Father    Stroke Maternal Grandmother    High blood pressure Maternal Grandmother    Diabetes Maternal Grandfather    High blood pressure Paternal Grandmother    Multiple sclerosis Sibling    High blood pressure Sibling    Breast cancer Neg Hx     Past Surgical History:  Procedure Laterality Date   ABDOMINOPLASTY     ANTERIOR CRUCIATE LIGAMENT REPAIR Left 2013   BREAST ENHANCEMENT SURGERY     COLONOSCOPY WITH PROPOFOL N/A 10/03/2022   Procedure: COLONOSCOPY WITH PROPOFOL;  Surgeon: Toney Reil, MD;  Location: [facility];  Service: Gastroenterology;  Laterality: N/A;   COSMETIC SURGERY     arm and thigh lift   CYSTOSCOPY N/A 01/19/2023   Procedure: CYSTOSCOPY;  Surgeon: Christeen Douglas, MD;  Location: [facility] ORS;  Service: Gynecology;  Laterality: N/A;   EYE SURGERY Bilateral    FRACTURE SURGERY Left 05/05/2022   Foot   ROBOTIC ASSISTED TOTAL HYSTERECTOMY WITH BILATERAL SALPINGO OOPHERECTOMY Right 01/19/2023   Procedure: XI ROBOTIC ASSISTED TOTAL HYSTERECTOMY WITH BILATERAL SALPINGECTOMY AND RIGHT OOPHORECTOMY;  Surgeon: Christeen Douglas, MD;  Location: [facility] ORS;  Service: Gynecology;  Laterality: Right;  RNFA NEEDED   TONSILLECTOMY     Social History   Occupational History   Not on file  Tobacco Use   Smoking status: Never   Smokeless tobacco: Never  Vaping Use   Vaping status: Never Used  Substance and Sexual Activity   Alcohol use: Never   Drug use: Never   Sexual activity: Not on file

## 2023-03-07 NOTE — Telephone Encounter (Signed)
VOB submitted for Monovisc, left knee 

## 2023-03-14 DIAGNOSIS — R300 Dysuria: Secondary | ICD-10-CM | POA: Diagnosis not present

## 2023-03-14 NOTE — Telephone Encounter (Signed)
Hi Lauren can you get these trackers to at least read and give [place] a reading so that I can have it in hand when I replied at this request.  Thanks

## 2023-03-14 NOTE — Telephone Encounter (Signed)
Hi Kimberly Good has she been set up for gel yet and if so can you let her know when it has been approved.  Thanks

## 2023-03-15 ENCOUNTER — Telehealth: Payer: Self-pay

## 2023-03-15 NOTE — Telephone Encounter (Signed)
Predetermination has been started with [employer] FEP. Per [place] D. Office notes need to be faxed to [phone].   Reference# [place] [id number]   Office Notes faxed  Pending Case #[id number]

## 2023-03-15 NOTE — Telephone Encounter (Signed)
Predetermination was required and has been started Pending case# [id number]

## 2023-03-16 NOTE — Telephone Encounter (Signed)
So when can she get the shot?

## 2023-03-16 NOTE — Telephone Encounter (Signed)
Please advise 

## 2023-03-16 NOTE — Telephone Encounter (Signed)
FYI  Authorization has been submitted for gel injection.  Authorization is pending for approval.  Currently waiting to see if gel injection will be approved by her insurance.  Please see previous message for authorization information.

## 2023-04-03 ENCOUNTER — Telehealth: Payer: Self-pay | Admitting: Orthopedic Surgery

## 2023-04-03 NOTE — Telephone Encounter (Signed)
Talked with patient concerning gel injection being denied.  Please see previous message in chart.

## 2023-04-03 NOTE — Telephone Encounter (Signed)
Patient called asked for a call back concerning the denial for the Monovisc injection. Patient said she was advised that she can not appeal their decision.  Patient asked for a call back as soon as possible.  The number to contact patient is  [phone]

## 2023-04-03 NOTE — Telephone Encounter (Signed)
Talked with patient concerning gel injection being denied due to not being medically necessary, patient not trying/failing physical therapy, taping, medication by mouth to help with knee pain, and x-rays showing the presence of at least a minimal osteoarthritis.  Per patient, she is not trying to have a surgery.  Please advise on next option

## 2023-04-05 ENCOUNTER — Ambulatory Visit: Payer: Federal, State, Local not specified - PPO | Admitting: Family Medicine

## 2023-04-06 NOTE — Telephone Encounter (Signed)
PT referral placed in chart.

## 2023-04-06 NOTE — Telephone Encounter (Signed)
Please set up therapy with [place] 1 day a week to 2 days a week for 1 week to 2 weeks for nonload bearing quad strengthening exercises with a home exercise program.  Thanks

## 2023-04-06 NOTE — Telephone Encounter (Signed)
I just sent a message to somebody but in general we will try physical therapy and then recheck her in 6 weeks.  I asked Kimberly Good I think to set up physical therapy with [place] 1-2 times a week for 1 to 2 weeks for nonweightbearing quad strengthening exercises with a home exercise program.  Can you also make her an appointment to follow-up in 6 weeks.  Thanks

## 2023-04-06 NOTE — Telephone Encounter (Signed)
PT order was placed in chart

## 2023-04-25 ENCOUNTER — Ambulatory Visit (HOSPITAL_COMMUNITY): Payer: Federal, State, Local not specified - PPO | Attending: Orthopedic Surgery

## 2023-04-25 DIAGNOSIS — R262 Difficulty in walking, not elsewhere classified: Secondary | ICD-10-CM | POA: Diagnosis not present

## 2023-04-25 DIAGNOSIS — M25462 Effusion, left knee: Secondary | ICD-10-CM | POA: Diagnosis not present

## 2023-04-25 DIAGNOSIS — M25562 Pain in left knee: Secondary | ICD-10-CM | POA: Diagnosis not present

## 2023-04-25 DIAGNOSIS — M6281 Muscle weakness (generalized): Secondary | ICD-10-CM | POA: Diagnosis not present

## 2023-04-25 NOTE — Therapy (Signed)
 Kimberly Good OUTPATIENT PHYSICAL THERAPY EVALUATION (LOWER EXTREMITY)   Patient Name: Kimberly Good MRN: [id number] DOB:[birth year], 52 y.o., female Today's Date: 04/25/2023  END OF SESSION:   PT End of Session - 04/25/23 0857     Visit Number 1    Number of Visits 8    Authorization Type Aetna( pending new insurance card    PT Start Time 0845    PT Stop Time [phone]    PT Time Calculation (min) 40 min    Activity Tolerance Patient tolerated treatment well    Behavior During Therapy [facility] for tasks assessed/performed              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   S/P eye surgery 10/19/2017   Tear of lateral cartilage or meniscus of knee, current 02/07/2012   Past Surgical History:  Procedure Laterality Date   ABDOMINOPLASTY     ANTERIOR CRUCIATE LIGAMENT REPAIR Left 2013   BREAST ENHANCEMENT SURGERY     COLONOSCOPY WITH PROPOFOL  N/A 10/03/2022   Procedure: COLONOSCOPY WITH PROPOFOL ;  Surgeon: Unk Corinn Skiff, MD;  Location: [facility];  Service: Gastroenterology;  Laterality: N/A;   COSMETIC SURGERY     arm and thigh lift   CYSTOSCOPY N/A 01/19/2023   Procedure: CYSTOSCOPY;  Surgeon: Verdon Keen, MD;  Location: [facility];  Service: Gynecology;  Laterality: N/A;   EYE SURGERY Bilateral    FRACTURE SURGERY Left 05/05/2022   Foot   ROBOTIC ASSISTED TOTAL HYSTERECTOMY WITH BILATERAL SALPINGO OOPHERECTOMY Right 01/19/2023   Procedure: XI ROBOTIC ASSISTED TOTAL HYSTERECTOMY WITH BILATERAL SALPINGECTOMY AND RIGHT OOPHORECTOMY;  Surgeon: Verdon Keen, MD;  Location: [facility];  Service: Gynecology;  Laterality: Right;  RNFA NEEDED   TONSILLECTOMY     Patient Active Problem List   Diagnosis Date Noted   Annual physical exam 01/03/2023    Bilateral leg edema 08/23/2022   Healthcare maintenance 08/23/2022   Arthritis of left midfoot 01/28/2022   Diplopia 07/31/2017   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    PCP: Sharma Coyer, MDPCP - General   REFERRING PROVIDER:   Addie Cordella Hamilton, MD    REFERRING DIAG:  Diagnosis  M25.562 (ICD-10-CM) - Left knee pain, unspecified chronicity  M25.462 (ICD-10-CM) - Effusion, left knee    Rationale for Evaluation and Treatment: Rehabilitation  THERAPY DIAG:  Difficulty in walking, not elsewhere classified  Left knee pain, unspecified chronicity  Muscle weakness (generalized)  ONSET DATE: Sept 2024    SUBJECTIVE:  SUBJECTIVE STATEMENT: Patient had ACL/MCL surgery on left knee 2013; left ankle fracture March 2022 which was addressed with surgery Jan 2024- midfoot lisfranc fusion- multiple joints. Patient underwent hysterectomy September 2024; has been low on physical activity since then. Cortisone shot done to left knee, no change in pain.   PERTINENT HISTORY:  N/a  PAIN:   Are you having pain? Yes: NPRS scale: 5/10 Pain location: left knee Pain description: sharp Aggravating factors: walking, bending  Relieving factors: rest   PRECAUTIONS: None  RED FLAGS: None   WEIGHT BEARING RESTRICTIONS: No  FALLS:  Has patient fallen in last 6 months? No   OCCUPATION: retired; media planner   PLOF: Independent  PATIENT GOALS: To return to kickboxing  NEXT MD VISIT: TBD    OBJECTIVE:   DIAGNOSTIC FINDINGS:  Left knee  IMPRESSION: 1. Tricompartmental osteoarthritis, most pronounced within the medial compartment. 2. 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 3. Prior ACL reconstruction. ACL graft is intact, although appears to demonstrate a degree of  laxity. Correlate with physical exam. 4. Prior MCL repair without evidence of recent injury. 5. Small-moderate sized knee joint effusion.    PATIENT SURVEYS:  LEFS 30/80  SCREENING FOR RED FLAGS: Bowel or bladder incontinence: No Spinal tumors: No Cauda equina syndrome: No Compression fracture: No Abdominal aneurysm: No  COGNITION: Overall cognitive status: Within functional limits for tasks assessed  POSTURE: weight shift right      FUNCTIONAL TESTS:  5 times sit to stand: NEXT SESSION   GAIT ANALYSIS: Distance walked: 25 ft Assistive device utilized: None Level of assistance: Complete Independence Comments: Patient with moderate left lower extremity antalgia  SENSATION: WFL    LOWER EXTREMITY MMT:    MMT Right eval Left eval  Hip flexion    Hip extension    Hip abduction    Hip adduction    Hip internal rotation    Hip external rotation    Knee flexion 4/5 3-/5  Knee extension 4/5 3-/5  Ankle dorsiflexion    Ankle plantarflexion    Ankle inversion    Ankle eversion     (Blank rows = not tested)  LOWER EXTREMITY ROM:     Passive  Right eval Left eval  Hip flexion    Hip extension    Hip abduction    Hip adduction    Hip internal rotation    Hip external rotation    Knee flexion  0-90  Knee extension  (2)  Ankle dorsiflexion    Ankle plantarflexion    Ankle inversion    Ankle eversion     (Blank rows = not tested)  PALPATION: Mod tenderness to palpation left knee INTEGUMENTARY   Left knee mid-patella circumferential measurement 54.5 cm     TODAY'S TREATMENT:  DATE:   04/25/23 PT Initial Eval   PATIENT EDUCATION:  Education details: pain management, activity modification  Person educated: Patient Education method: Explanation Education comprehension: verbalized understanding  HOME EXERCISE  PROGRAM: TBD    ASSESSMENT:  CLINICAL IMPRESSION: Patient is a 52 y.o. y.o. female who was seen today for physical therapy evaluation and treatment for left knee pain, troubles walking. Patient presents to PT with the following objective impairments: Abnormal gait, decreased activity tolerance, decreased endurance, difficulty walking, decreased ROM, decreased strength, increased edema, improper body mechanics, postural dysfunction, and pain. These impairments limit the patient in activities such as carrying, lifting, standing, squatting, stairs, locomotion level, and caring for others. These impairments also limit the patient in participation such as meal prep, cleaning, laundry, shopping, community activity, and yard work. The patient will benefit from PT to address the limitations/impairments listed below to return to their prior level of function in the domains of activity and participation.    PERSONAL FACTORS:  PMH left ankle/ left knee surgery   are also affecting patient's functional outcome.   REHAB POTENTIAL: Good  CLINICAL DECISION MAKING: Stable/uncomplicated  EVALUATION COMPLEXITY: Low     GOALS: Goals reviewed with patient? No  SHORT TERM GOALS: Target date: 05/16/2023    Patient will score a >/= 38/80 on the LEFS    to demonstrate a Minimally Clinically Importance Difference (MCID) in ADL completion, home/community ambulation, and lifting/bending/squatting.  Baseline: Goal status: INITIAL   2. Patient will be independent with a basic stretching/strengthening HEP  Baseline:  Goal status: INITIAL   LONG TERM GOALS: Target date: 06/06/2023    Patient will score a >/= 46/80 on the LEFS    to demonstrate a Minimally Clinically Importance Difference (MCID) in ADL completion, home/community ambulation, and lifting/bending/squatting. Baseline:  Goal status: INITIAL   2.  Patient will be independent with a comprehensive strengthening HEP  Baseline:  Goal status:  INITIAL  3. Patient will be able to demonstrate knee flexion/ extension active range of motion to [facility] degrees to facilitate ADL completion, lifting, squatting, walking. Baseline:  Goal status: INITIAL    PLAN:  PT FREQUENCY: 1-2x/week  PT DURATION: 6 weeks  PLANNED INTERVENTIONS: 97110-Therapeutic exercises, 97530- Therapeutic activity, 97112- Neuromuscular re-education, 97535- Self Care, [id number]- Manual therapy, [phone]- Gait training, Patient/Family education, Balance training, Stair training, Taping, Dry Needling, Joint mobilization, Joint manipulation, Cryotherapy, and Moist heat.  PLAN FOR NEXT SESSION: Progress lower extremity active range of motion/ strength as tolerated    Deward Ming, PT 04/25/2023, 11:33 AM

## 2023-05-02 ENCOUNTER — Encounter (HOSPITAL_COMMUNITY): Payer: Federal, State, Local not specified - PPO

## 2023-05-03 ENCOUNTER — Other Ambulatory Visit: Payer: Self-pay | Admitting: Family Medicine

## 2023-05-03 DIAGNOSIS — R600 Localized edema: Secondary | ICD-10-CM

## 2023-05-04 ENCOUNTER — Encounter (HOSPITAL_COMMUNITY): Payer: Federal, State, Local not specified - PPO

## 2023-05-08 ENCOUNTER — Encounter (HOSPITAL_COMMUNITY): Payer: Federal, State, Local not specified - PPO

## 2023-05-11 ENCOUNTER — Ambulatory Visit (HOSPITAL_COMMUNITY): Payer: 59 | Attending: Orthopedic Surgery

## 2023-05-11 DIAGNOSIS — R262 Difficulty in walking, not elsewhere classified: Secondary | ICD-10-CM | POA: Insufficient documentation

## 2023-05-11 DIAGNOSIS — M6281 Muscle weakness (generalized): Secondary | ICD-10-CM | POA: Insufficient documentation

## 2023-05-11 DIAGNOSIS — M25562 Pain in left knee: Secondary | ICD-10-CM | POA: Insufficient documentation

## 2023-05-11 NOTE — Therapy (Signed)
[name] Kitchen OUTPATIENT PHYSICAL THERAPY EVALUATION (LOWER EXTREMITY)   Patient Name: Kimberly Good MRN: [id number] DOB:[birth year], 53 y.o., female Today's Date: 05/11/2023  END OF SESSION:   PT End of Session - 05/11/23 0935     Visit Number 2    Number of Visits 8    Authorization Type [employer]et[employer] NAP    PT Start Time 0932    PT Stop Time 1012    PT Time Calculation (min) 40 min    Activity Tolerance Patient tolerated treatment well    Behavior During Therapy [facility] for tasks assessed/performed              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   S/P eye surgery 10/19/2017   Tear of lateral cartilage or meniscus of knee, current 02/07/2012   Past Surgical History:  Procedure Laterality Date   ABDOMINOPLASTY     ANTERIOR CRUCIATE LIGAMENT REPAIR Left 2013   BREAST ENHANCEMENT SURGERY     COLONOSCOPY WITH PROPOFOL N/A 10/03/2022   Procedure: COLONOSCOPY WITH PROPOFOL;  Surgeon: Toney Reil, MD;  Location: [facility];  Service: Gastroenterology;  Laterality: N/A;   COSMETIC SURGERY     arm and thigh lift   CYSTOSCOPY N/A 01/19/2023   Procedure: CYSTOSCOPY;  Surgeon: Christeen Douglas, MD;  Location: [facility];  Service: Gynecology;  Laterality: N/A;   EYE SURGERY Bilateral    FRACTURE SURGERY Left 05/05/2022   Foot   ROBOTIC ASSISTED TOTAL HYSTERECTOMY WITH BILATERAL SALPINGO OOPHERECTOMY Right 01/19/2023   Procedure: XI ROBOTIC ASSISTED TOTAL HYSTERECTOMY WITH BILATERAL SALPINGECTOMY AND RIGHT OOPHORECTOMY;  Surgeon: Christeen Douglas, MD;  Location: [facility];  Service: Gynecology;  Laterality: Right;  RNFA NEEDED   TONSILLECTOMY     Patient Active Problem List   Diagnosis Date Noted   Annual physical exam 01/03/2023   Bilateral leg edema  08/23/2022   Healthcare maintenance 08/23/2022   Arthritis of left midfoot 01/28/2022   Diplopia 07/31/2017   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 Has been biking @ home   Eval: Patient had ACL/MCL surgery on left knee 2013; left ankle fracture March 2022 which was addressed with surgery Jan 2024- midfoot lisfranc fusion- multiple joints. Patient underwent hysterectomy September 2024; has been low on physical activity since then. Cortisone shot done to left knee, no change in pain.   PERTINENT HISTORY:  N/a  PAIN:   Are you having pain? Yes: NPRS scale: 5/10 Pain location: left knee Pain description: sharp Aggravating factors: walking, bending  Relieving factors: rest   PRECAUTIONS: None  RED FLAGS: None   WEIGHT BEARING RESTRICTIONS: No  FALLS:  Has patient fallen in last 6 months? No   OCCUPATION: retired; Media planner   PLOF: Independent  PATIENT GOALS: To return to kickboxing  NEXT MD VISIT: TBD    OBJECTIVE:   DIAGNOSTIC FINDINGS:  Left knee  IMPRESSION: 1. Tricompartmental osteoarthritis, most pronounced within the medial compartment. 2. 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 3. Prior ACL reconstruction. ACL graft is  intact, although appears to demonstrate a degree of laxity. Correlate with physical exam. 4. Prior MCL repair without evidence of recent injury. 5. Small-moderate sized knee joint effusion.    PATIENT SURVEYS:  LEFS 30/80  SCREENING FOR RED FLAGS: Bowel or bladder incontinence: No Spinal tumors: No Cauda equina syndrome: No Compression fracture: No Abdominal aneurysm: No  COGNITION: Overall cognitive status: Within functional limits for tasks assessed  POSTURE: weight shift right      FUNCTIONAL TESTS:  5 times sit to stand: NEXT SESSION   GAIT ANALYSIS: Distance walked: 25 ft Assistive device utilized: None Level of assistance: Complete Independence Comments: Patient with moderate left lower extremity antalgia  SENSATION: WFL    LOWER EXTREMITY MMT:    MMT Right eval Left eval  Hip flexion    Hip extension    Hip abduction    Hip adduction    Hip internal rotation    Hip external rotation    Knee flexion 4/5 3-/5  Knee extension 4/5 3-/5  Ankle dorsiflexion    Ankle plantarflexion    Ankle inversion    Ankle eversion     (Blank rows = not tested)  LOWER EXTREMITY ROM:     Passive  Right eval Left eval  Hip flexion    Hip extension    Hip abduction    Hip adduction    Hip internal rotation    Hip external rotation    Knee flexion  0-90  Knee extension  (2)  Ankle dorsiflexion    Ankle plantarflexion    Ankle inversion    Ankle eversion     (Blank rows = not tested)  PALPATION: Mod tenderness to palpation left knee + moderate tenderness to palpation left pes anserine 05/11/23 INTEGUMENTARY   Left knee mid-patella circumferential measurement 54.5 cm      TODAY'S TREATMENT:  DATE:   05/11/23 Supine  Manual therapy: soft tissue mobilization to medial left knee  SAQs with 5# 2x10  Partial SLRs with 1#  2x10  LAQs with 5# 2x10  04/25/23 PT Initial Eval   PATIENT EDUCATION:  Education details: pain management, activity modification  Person educated: Patient Education method: Explanation Education comprehension: verbalized understanding  HOME EXERCISE PROGRAM: Access Code: [email or web address] URL: [email or web address] Date: 05/11/2023 Prepared by: Seymour Bars  Exercises - Small Range Straight Leg Raise  - 3 x weekly - 3 sets - 10 reps - Short Arc Quad with Ankle Weight  - 3 x weekly - 3 sets - 10 reps - Seated Long Arc Quad with Ankle Weight  - 3 x weekly - 3 sets - 10 reps - Upright Bike  - 3 x weekly - 5-10 min hold    ASSESSMENT:  CLINICAL IMPRESSION: Today: PT initiated basic lower extremity strengthening/ active range of motion program today. PT first assessed knee area with manual therapy. Patient with moderate tenderness to palpation along left medial knee @ pes anserine. PT then began to strengthen left quad to improve patellar tracking to prepare patient for weightbearing exercises. Patient requires minimal verbal cues for proper therapeutic exercise progression. Patient tolerated session well with no increase in pain.  The patient will benefit from PT to address the limitations/impairments listed below to return to their prior level of function in the domains of activity and participation.   Patient is a 53 y.o. y.o. female who was seen today for physical therapy evaluation and treatment for left knee pain, troubles walking. Patient presents to PT with the following objective impairments: Abnormal gait, decreased activity tolerance, decreased endurance, difficulty walking, decreased ROM, decreased strength, increased edema, improper body mechanics, postural dysfunction, and pain. These impairments limit the patient in activities such as carrying, lifting, standing, squatting, stairs, locomotion level, and caring for others. These impairments also limit the patient in  participation such as meal prep, cleaning, laundry, shopping, community activity, and yard work. The patient will benefit from PT to address the limitations/impairments listed below to return to their prior level of function in the domains of activity and participation.    PERSONAL FACTORS:  PMH left ankle/ left knee surgery   are also affecting patient's functional outcome.   REHAB POTENTIAL: Good  CLINICAL DECISION MAKING: Stable/uncomplicated  EVALUATION COMPLEXITY: Low     GOALS: Goals reviewed with patient? No  SHORT TERM GOALS: Target date: 05/16/2023    Patient will score a >/= 38/80 on the LEFS    to demonstrate a Minimally Clinically Importance Difference (MCID) in ADL completion, home/community ambulation, and lifting/bending/squatting.  Baseline: Goal status: INITIAL   2. Patient will be independent with a basic stretching/strengthening HEP  Baseline:  Goal status: INITIAL   LONG TERM GOALS: Target date: 06/06/2023    Patient will score a >/= 46/80 on the LEFS    to demonstrate a Minimally Clinically Importance Difference (MCID) in ADL completion, home/community ambulation, and lifting/bending/squatting. Baseline:  Goal status: INITIAL   2.  Patient will be independent with a comprehensive strengthening HEP  Baseline:  Goal status: INITIAL  3. Patient will be able to demonstrate knee flexion/ extension active range of motion to [facility] degrees to facilitate ADL completion, lifting, squatting, walking. Baseline:  Goal status: INITIAL    PLAN:  PT FREQUENCY: 1-2x/week  PT DURATION: 6 weeks  PLANNED INTERVENTIONS: 97110-Therapeutic exercises, 97530- Therapeutic activity, [id number]- Neuromuscular re-education, 97535- Self Care, [id number]- Manual therapy, [phone]- Gait training,  Patient/Family education, Balance training, Stair training, Taping, Dry Needling, Joint mobilization, Joint manipulation, Cryotherapy, and Moist heat.  PLAN FOR NEXT SESSION: Progress lower  extremity active range of motion/ strength as tolerated    Seymour Bars, PT 05/11/2023, 9:36 AM

## 2023-05-15 ENCOUNTER — Encounter (HOSPITAL_COMMUNITY): Payer: Federal, State, Local not specified - PPO

## 2023-05-17 ENCOUNTER — Ambulatory Visit (HOSPITAL_COMMUNITY): Payer: Self-pay

## 2023-05-17 DIAGNOSIS — M6281 Muscle weakness (generalized): Secondary | ICD-10-CM

## 2023-05-17 DIAGNOSIS — M25562 Pain in left knee: Secondary | ICD-10-CM

## 2023-05-17 DIAGNOSIS — R262 Difficulty in walking, not elsewhere classified: Secondary | ICD-10-CM

## 2023-05-17 NOTE — Therapy (Signed)
[name] OUTPATIENT PHYSICAL THERAPY EVALUATION (LOWER EXTREMITY)   Patient Name: Kimberly Good MRN: [id number] DOB:Oct 22, 1970, 53 y.o., female Today's Date: 05/17/2023  END OF SESSION:   PT End of Session - 05/17/23 1516     Visit Number 3    Number of Visits 8    Date for PT Re-Evaluation 06/06/23    Authorization Type [employer] NAP    Authorization Time Period no [place]    PT Start Time 0317    PT Stop Time 0357    PT Time Calculation (min) 40 min    Activity Tolerance Patient tolerated treatment well    Behavior During Therapy [facility] for tasks assessed/performed              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   S/P eye surgery 10/19/2017   Tear of lateral cartilage or meniscus of knee, current 02/07/2012   Past Surgical History:  Procedure Laterality Date   ABDOMINOPLASTY     ANTERIOR CRUCIATE LIGAMENT REPAIR Left 2013   BREAST ENHANCEMENT SURGERY     COLONOSCOPY WITH PROPOFOL N/A 10/03/2022   Procedure: COLONOSCOPY WITH PROPOFOL;  Surgeon: Toney Reil, MD;  Location: [facility];  Service: Gastroenterology;  Laterality: N/A;   COSMETIC SURGERY     arm and thigh lift   CYSTOSCOPY N/A 01/19/2023   Procedure: CYSTOSCOPY;  Surgeon: Christeen Douglas, MD;  Location: [facility];  Service: Gynecology;  Laterality: N/A;   EYE SURGERY Bilateral    FRACTURE SURGERY Left 05/05/2022   Foot   ROBOTIC ASSISTED TOTAL HYSTERECTOMY WITH BILATERAL SALPINGO OOPHERECTOMY Right 01/19/2023   Procedure: XI ROBOTIC ASSISTED TOTAL HYSTERECTOMY WITH BILATERAL SALPINGECTOMY AND RIGHT OOPHORECTOMY;  Surgeon: Christeen Douglas, MD;  Location: [facility] OR[facility];  Service: Gynecology;  Laterality: Right;  RNFA NEEDED   TONSILLECTOMY     Patient Active Problem List    Diagnosis Date Noted   Annual physical exam 01/03/2023   Bilateral leg edema 08/23/2022   Healthcare maintenance 08/23/2022   Arthritis of left midfoot 01/28/2022   Diplopia 07/31/2017   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  ONSET DATE: Sept 2024    SUBJECTIVE:  SUBJECTIVE STATEMENT: Today: Patient with 2/10 left knee pain. Has been biking @ home   Eval: Patient had ACL/MCL surgery on left knee 2013; left ankle fracture March 2022 which was addressed with surgery Jan 2024- midfoot lisfranc fusion- multiple joints. Patient underwent hysterectomy September 2024; has been low on physical activity since then. Cortisone shot done to left knee, no change in pain.   PERTINENT HISTORY:  N/a  PAIN:   Are you having pain? Yes: NPRS scale: 5/10 Pain location: left knee Pain description: sharp Aggravating factors: walking, bending  Relieving factors: rest   PRECAUTIONS: None  RED FLAGS: None   WEIGHT BEARING RESTRICTIONS: No  FALLS:  Has patient fallen in last 6 months? No   OCCUPATION: retired; Media planner   PLOF: Independent  PATIENT GOALS: To return to kickboxing  NEXT MD VISIT: TBD    OBJECTIVE:   DIAGNOSTIC FINDINGS:  Left knee  IMPRESSION: 1. Tricompartmental osteoarthritis, most pronounced within the medial compartment. 2. 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 3. Prior ACL reconstruction. ACL graft is intact, although  appears to demonstrate a degree of laxity. Correlate with physical exam. 4. Prior MCL repair without evidence of recent injury. 5. Small-moderate sized knee joint effusion.    PATIENT SURVEYS:  LEFS 30/80  SCREENING FOR RED FLAGS: Bowel or bladder incontinence: No Spinal tumors: No Cauda equina syndrome: No Compression fracture: No Abdominal aneurysm: No  COGNITION: Overall cognitive status: Within functional limits for tasks assessed  POSTURE: weight shift right      FUNCTIONAL TESTS:     GAIT ANALYSIS: Distance walked: 25 ft Assistive device utilized: None Level of assistance: Complete Independence Comments: Patient with moderate left lower extremity antalgia  SENSATION: WFL    LOWER EXTREMITY MMT:    MMT Right eval Left eval  Hip flexion    Hip extension    Hip abduction    Hip adduction    Hip internal rotation    Hip external rotation    Knee flexion 4/5 3-/5  Knee extension 4/5 3-/5  Ankle dorsiflexion    Ankle plantarflexion    Ankle inversion    Ankle eversion     (Blank rows = not tested)  LOWER EXTREMITY ROM:     Passive  Right eval Left eval  Hip flexion    Hip extension    Hip abduction    Hip adduction    Hip internal rotation    Hip external rotation    Knee flexion  0-90  Knee extension  (2)  Ankle dorsiflexion    Ankle plantarflexion    Ankle inversion    Ankle eversion     (Blank rows = not tested)  PALPATION: Mod tenderness to palpation left knee + moderate tenderness to palpation left pes anserine 05/11/23 INTEGUMENTARY   Left knee mid-patella circumferential measurement 54.5 cm      TODAY'S TREATMENT:  DATE:   05/17/23 Supine  Manual therapy to left knee: AP glides Gr 2-3, hamstring manual stretch to improve knee extension PROM, left popliteal soft tissue mobilization   HS stretch with  strap   Left quad sets 10x3-5" H  Left SAQS with 3# 2x10  Gait Training with right single point cane   Weighted sled pushes to facilitate normal gait      05/11/23 Supine  Manual therapy: soft tissue mobilization to medial left knee  SAQs with 5# 2x10  Partial SLRs with 1# 2x10  LAQs with 5# 2x10  04/25/23 PT Initial Eval   PATIENT EDUCATION:  Education details: pain management, activity modification  Person educated: Patient Education method: Explanation Education comprehension: verbalized understanding  HOME EXERCISE PROGRAM: Access Code: [email or web address] URL: [email or web address] Date: 05/17/2023 Prepared by: Seymour Bars  Exercises - Seated Hamstring Stretch  - 3 x weekly - 10 reps - 10 seconds hold - Supine Quad Set  - 3 x weekly - 3 sets - 10 reps - Small Range Straight Leg Raise  - 3 x weekly - 3 sets - 10 reps - Short Arc Quad with Ankle Weight  - 3 x weekly - 3 sets - 10 reps - Seated Long Arc Quad with Ankle Weight  - 3 x weekly - 3 sets - 10 reps - Upright Bike  - 3 x weekly - 5-10 min hold   ASSESSMENT:  CLINICAL IMPRESSION: Today: Patient still missing ~2* of left knee extension pre session. Session focused on manual therapy to increase left knee extension active range of motion followed by re-training of Quadriceps muscles. PT then continued with Gait Training with single point cane and sled pushes to normalize gait pattern. At end of session, patient able to fully extend left knee and walk with mild antalgia, no pain.  The patient will benefit from PT to address the limitations/impairments listed below to return to their prior level of function in the domains of activity and participation.   Patient is a 53 y.o. y.o. female who was seen today for physical therapy evaluation and treatment for left knee pain, troubles walking. Patient presents to PT with the following objective impairments: Abnormal gait, decreased activity tolerance, decreased  endurance, difficulty walking, decreased ROM, decreased strength, increased edema, improper body mechanics, postural dysfunction, and pain. These impairments limit the patient in activities such as carrying, lifting, standing, squatting, stairs, locomotion level, and caring for others. These impairments also limit the patient in participation such as meal prep, cleaning, laundry, shopping, community activity, and yard work. The patient will benefit from PT to address the limitations/impairments listed below to return to their prior level of function in the domains of activity and participation.    PERSONAL FACTORS:  PMH left ankle/ left knee surgery   are also affecting patient's functional outcome.   REHAB POTENTIAL: Good  CLINICAL DECISION MAKING: Stable/uncomplicated  EVALUATION COMPLEXITY: Low     GOALS: Goals reviewed with patient? No  SHORT TERM GOALS: Target date: 05/16/2023    Patient will score a >/= 38/80 on the LEFS    to demonstrate a Minimally Clinically Importance Difference (MCID) in ADL completion, home/community ambulation, and lifting/bending/squatting.  Baseline: Goal status: INITIAL   2. Patient will be independent with a basic stretching/strengthening HEP  Baseline:  Goal status: INITIAL   LONG TERM GOALS: Target date: 06/06/2023    Patient will score a >/= 46/80 on the LEFS    to demonstrate a Minimally Clinically Importance Difference (MCID) in ADL  completion, home/community ambulation, and lifting/bending/squatting. Baseline:  Goal status: INITIAL   2.  Patient will be independent with a comprehensive strengthening HEP  Baseline:  Goal status: INITIAL  3. Patient will be able to demonstrate knee flexion/ extension active range of motion to [facility] degrees to facilitate ADL completion, lifting, squatting, walking. Baseline:  Goal status: INITIAL    PLAN:  PT FREQUENCY: 1-2x/week  PT DURATION: 6 weeks  PLANNED INTERVENTIONS: 97110-Therapeutic  exercises, 97530- Therapeutic activity, 97112- Neuromuscular re-education, 97535- Self Care, 04540- Manual therapy, [phone]- Gait training, Patient/Family education, Balance training, Stair training, Taping, Dry Needling, Joint mobilization, Joint manipulation, Cryotherapy, and Moist heat.  PLAN FOR NEXT SESSION: Progress lower extremity active range of motion/ strength as tolerated    Seymour Bars, PT 05/17/2023, 3:20 PM

## 2023-05-18 ENCOUNTER — Encounter (HOSPITAL_COMMUNITY): Payer: Federal, State, Local not specified - PPO

## 2023-05-19 ENCOUNTER — Ambulatory Visit (HOSPITAL_COMMUNITY): Payer: 59

## 2023-05-19 DIAGNOSIS — R262 Difficulty in walking, not elsewhere classified: Secondary | ICD-10-CM | POA: Diagnosis not present

## 2023-05-19 DIAGNOSIS — M6281 Muscle weakness (generalized): Secondary | ICD-10-CM

## 2023-05-19 DIAGNOSIS — M25562 Pain in left knee: Secondary | ICD-10-CM

## 2023-05-19 NOTE — Therapy (Signed)
[name] OUTPATIENT PHYSICAL THERAPY(LOWER EXTREMITY)   Patient Name: Kimberly Good MRN: [id number] DOB:[birth year], 53 y.o., female Today's Date: 05/19/2023  END OF SESSION:   PT End of Session - 05/19/23 1340     Visit Number 4    Number of Visits 8    Date for PT Re-Evaluation 06/06/23    Authorization Type [employer] NAP    Authorization Time Period no auth req    PT Start Time 0145    PT Stop Time 0225    PT Time Calculation (min) 40 min    Activity Tolerance Patient tolerated treatment well    Behavior During Therapy WFL for tasks assessed/performed              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   S/P eye surgery 10/19/2017   Tear of lateral cartilage or meniscus of knee, current 02/07/2012   Past Surgical History:  Procedure Laterality Date   ABDOMINOPLASTY     ANTERIOR CRUCIATE LIGAMENT REPAIR Left 2013   BREAST ENHANCEMENT SURGERY     COLONOSCOPY WITH PROPOFOL N/A 10/03/2022   Procedure: COLONOSCOPY WITH PROPOFOL;  Surgeon: Toney Reil, MD;  Location: [facility];  Service: Gastroenterology;  Laterality: N/A;   COSMETIC SURGERY     arm and thigh lift   CYSTOSCOPY N/A 01/19/2023   Procedure: CYSTOSCOPY;  Surgeon: Christeen Douglas, MD;  Location: [facility];  Service: Gynecology;  Laterality: N/A;   EYE SURGERY Bilateral    FRACTURE SURGERY Left 05/05/2022   Foot   ROBOTIC ASSISTED TOTAL HYSTERECTOMY WITH BILATERAL SALPINGO OOPHERECTOMY Right 01/19/2023   Procedure: XI ROBOTIC ASSISTED TOTAL HYSTERECTOMY WITH BILATERAL SALPINGECTOMY AND RIGHT OOPHORECTOMY;  Surgeon: Christeen Douglas, MD;  Location: [facility];  Service: Gynecology;  Laterality: Right;  RNFA NEEDED   TONSILLECTOMY     Patient Active Problem List   Diagnosis Date  Noted   Annual physical exam 01/03/2023   Bilateral leg edema 08/23/2022   Healthcare maintenance 08/23/2022   Arthritis of left midfoot 01/28/2022   Diplopia 07/31/2017   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    Eval: Patient had ACL/MCL surgery on left knee 2013; left ankle fracture March 2022 which was addressed with surgery Jan 2024- midfoot lisfranc fusion- multiple joints. Patient underwent hysterectomy September 2024; has been low on physical activity since then. Cortisone shot done to left knee, no change in pain.   PERTINENT HISTORY:  N/a  PAIN:   Are you having pain? Yes: NPRS scale: 5/10 Pain location: left knee Pain description: sharp Aggravating factors: walking, bending  Relieving factors: rest   PRECAUTIONS: None  RED FLAGS: None   WEIGHT BEARING RESTRICTIONS: No  FALLS:  Has patient fallen in last 6 months? No   OCCUPATION: retired; Media planner   PLOF: Independent  PATIENT GOALS: To return to kickboxing  NEXT MD VISIT: TBD    OBJECTIVE:   DIAGNOSTIC FINDINGS:  Left knee  IMPRESSION: 1. Tricompartmental osteoarthritis, most pronounced within the medial compartment. 2. 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 3.  Prior ACL reconstruction. ACL graft is intact, although appears to demonstrate a degree of laxity. Correlate with physical exam. 4. Prior MCL repair without evidence of recent injury. 5. Small-moderate sized knee joint effusion.    PATIENT SURVEYS:  LEFS 30/80  SCREENING FOR RED FLAGS: Bowel or bladder incontinence: No Spinal tumors: No Cauda equina syndrome: No Compression fracture: No Abdominal aneurysm: No  COGNITION: Overall cognitive status: Within functional limits for tasks assessed  POSTURE: weight shift right      FUNCTIONAL TESTS:     GAIT ANALYSIS: Distance walked: 25 ft Assistive device utilized: None Level of assistance: Complete Independence Comments: Patient with moderate left lower extremity antalgia  SENSATION: WFL    LOWER EXTREMITY MMT:    MMT Right eval Left eval  Hip flexion    Hip extension    Hip abduction    Hip adduction    Hip internal rotation    Hip external rotation    Knee flexion 4/5 3-/5  Knee extension 4/5 3-/5  Ankle dorsiflexion    Ankle plantarflexion    Ankle inversion    Ankle eversion     (Blank rows = not tested)  LOWER EXTREMITY ROM:     Passive  Right eval Left eval  Hip flexion    Hip extension    Hip abduction    Hip adduction    Hip internal rotation    Hip external rotation    Knee flexion  0-90  Knee extension  (2)  Ankle dorsiflexion    Ankle plantarflexion    Ankle inversion    Ankle eversion     (Blank rows = not tested)  PALPATION: Mod tenderness to palpation left knee + moderate tenderness to palpation left pes anserine 05/11/23 INTEGUMENTARY   Left knee mid-patella circumferential measurement 54.5 cm      TODAY'S TREATMENT:  DATE:   05/19/23 Supine Manual therapy to left knee: AP glides Gr 2-3, hamstring manual stretch to improve knee extension PROM SAQs  with 7.5# 2x10 Bilateral leg press machine/ heel raises with 50# 2x10 Single leg RDL's with [place] x10 to facilitate bending/lifting  Squats + heels elevated + [place] 2x10 to facilitate squatting      05/17/23 Supine  Manual therapy to left knee: AP glides Gr 2-3, hamstring manual stretch to improve knee extension PROM, left popliteal soft tissue mobilization   HS stretch with strap   Left quad sets 10x3-5" H  Left SAQS with 3# 2x10  Gait Training with right single point cane   Weighted sled pushes to facilitate normal gait      05/11/23 Supine  Manual therapy: soft tissue mobilization to medial left knee  SAQs with 5# 2x10  Partial SLRs with 1# 2x10  LAQs with 5# 2x10  04/25/23 PT Initial Eval   PATIENT EDUCATION:  Education details: pain management, activity modification  Person educated: Patient Education method: Explanation Education comprehension: verbalized understanding  HOME EXERCISE PROGRAM: Access Code: [email or web address] URL: [email or web address] Date: 05/19/2023 Prepared by: Seymour Bars  Exercises - Seated Hamstring Stretch  - 3 x weekly - 10 reps - 10 seconds hold - Supine Quad Set  - 3 x weekly - 3 sets - 10 reps - Small Range Straight Leg Raise  - 3 x weekly - 3 sets - 10 reps - Short Arc Quad with Ankle Weight  - 3 x weekly - 3 sets - 10 reps - Seated Long Arc Quad with Ankle Weight  - 3 x weekly - 3 sets - 10 reps - Upright Bike  - 3 x weekly - 5-10 min hold - [place] Deadlift With Barbell  - 3 x weekly - 3 sets - 10 reps - Squat with Heels on Half Foam Roll  - 3 x weekly - 3 sets - 10 reps - Standing Heel Raise  - 3 x weekly - 3 sets - 10 reps   ASSESSMENT:  CLINICAL IMPRESSION: Today: Session focused on manual therapy to increase left knee extension active range of motion followed by re-training of Quadriceps/HS muscles. PT then continued with Therapeutic Activity geared towards squatting/bending/lifting mechanics; no increase in  pain levels.  The patient will benefit from PT to address the limitations/impairments listed below to return to their prior level of function in the domains of activity and participation.   Patient is a 53 y.o. y.o. female who was seen today for physical therapy evaluation and treatment for left knee pain, troubles walking. Patient presents to PT with the following objective impairments: Abnormal gait, decreased activity tolerance, decreased endurance, difficulty walking, decreased ROM, decreased strength, increased edema, improper body mechanics, postural dysfunction, and pain. These impairments limit the patient in activities such as carrying, lifting, standing, squatting, stairs, locomotion level, and caring for others. These impairments also limit the patient in participation such as meal prep, cleaning, laundry, shopping, community activity, and yard work. The patient will benefit from PT to address the limitations/impairments listed below to return to their prior level of function in the domains of activity and participation.    PERSONAL FACTORS:  PMH left ankle/ left knee surgery   are also affecting patient's functional outcome.   REHAB POTENTIAL: Good  CLINICAL DECISION MAKING: Stable/uncomplicated  EVALUATION COMPLEXITY: Low     GOALS: Goals reviewed with patient? No  SHORT TERM GOALS: Target date: 05/16/2023    Patient will score a >/=  38/80 on the LEFS    to demonstrate a Minimally Clinically Importance Difference (MCID) in ADL completion, home/community ambulation, and lifting/bending/squatting.  Baseline: Goal status: INITIAL   2. Patient will be independent with a basic stretching/strengthening HEP  Baseline:  Goal status: INITIAL   LONG TERM GOALS: Target date: 06/06/2023    Patient will score a >/= 46/80 on the LEFS    to demonstrate a Minimally Clinically Importance Difference (MCID) in ADL completion, home/community ambulation, and  lifting/bending/squatting. Baseline:  Goal status: INITIAL   2.  Patient will be independent with a comprehensive strengthening HEP  Baseline:  Goal status: INITIAL  3. Patient will be able to demonstrate knee flexion/ extension active range of motion to [facility] degrees to facilitate ADL completion, lifting, squatting, walking. Baseline:  Goal status: INITIAL    PLAN:  PT FREQUENCY: 1-2x/week  PT DURATION: 6 weeks  PLANNED INTERVENTIONS: 97110-Therapeutic exercises, 97530- Therapeutic activity, 97112- Neuromuscular re-education, 97535- Self Care, 16109- Manual therapy, [phone]- Gait training, Patient/Family education, Balance training, Stair training, Taping, Dry Needling, Joint mobilization, Joint manipulation, Cryotherapy, and Moist heat.  PLAN FOR NEXT SESSION: Progress lower extremity active range of motion/ strength as tolerated    Seymour Bars, PT 05/19/2023, 1:40 PM

## 2023-05-22 ENCOUNTER — Ambulatory Visit (HOSPITAL_COMMUNITY): Payer: 59

## 2023-05-22 DIAGNOSIS — R262 Difficulty in walking, not elsewhere classified: Secondary | ICD-10-CM | POA: Diagnosis not present

## 2023-05-22 DIAGNOSIS — M6281 Muscle weakness (generalized): Secondary | ICD-10-CM

## 2023-05-22 DIAGNOSIS — M25562 Pain in left knee: Secondary | ICD-10-CM

## 2023-05-22 NOTE — Therapy (Signed)
[name] OUTPATIENT PHYSICAL THERAPY(LOWER EXTREMITY)   Patient Name: Kimberly Good MRN: [id number] DOB:07-Nov-1970, 53 y.o., female Today's Date: 05/22/2023  END OF SESSION:   PT End of Session - 05/22/23 0938     Visit Number 5    Number of Visits 8    Date for PT Re-Evaluation 06/06/23    Authorization Type [employer] NAP    Authorization Time Period no auth req    PT Start Time 0935    PT Stop Time 1013    PT Time Calculation (min) 38 min    Activity Tolerance Patient tolerated treatment well    Behavior During Therapy WFL for tasks assessed/performed              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   S/P eye surgery 10/19/2017   Tear of lateral cartilage or meniscus of knee, current 02/07/2012   Past Surgical History:  Procedure Laterality Date   ABDOMINOPLASTY     ANTERIOR CRUCIATE LIGAMENT REPAIR Left 2013   BREAST ENHANCEMENT SURGERY     COLONOSCOPY WITH PROPOFOL N/A 10/03/2022   Procedure: COLONOSCOPY WITH PROPOFOL;  Surgeon: Toney Reil, MD;  Location: [facility];  Service: Gastroenterology;  Laterality: N/A;   COSMETIC SURGERY     arm and thigh lift   CYSTOSCOPY N/A 01/19/2023   Procedure: CYSTOSCOPY;  Surgeon: Christeen Douglas, MD;  Location: [facility];  Service: Gynecology;  Laterality: N/A;   EYE SURGERY Bilateral    FRACTURE SURGERY Left 05/05/2022   Foot   ROBOTIC ASSISTED TOTAL HYSTERECTOMY WITH BILATERAL SALPINGO OOPHERECTOMY Right 01/19/2023   Procedure: XI ROBOTIC ASSISTED TOTAL HYSTERECTOMY WITH BILATERAL SALPINGECTOMY AND RIGHT OOPHORECTOMY;  Surgeon: Christeen Douglas, MD;  Location: [facility];  Service: Gynecology;  Laterality: Right;  RNFA NEEDED   TONSILLECTOMY     Patient Active Problem List   Diagnosis Date  Noted   Annual physical exam 01/03/2023   Bilateral leg edema 08/23/2022   Healthcare maintenance 08/23/2022   Arthritis of left midfoot 01/28/2022   Diplopia 07/31/2017   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    Eval: Patient had ACL/MCL surgery on left knee 2013; left ankle fracture March 2022 which was addressed with surgery Jan 2024- midfoot lisfranc fusion- multiple joints. Patient underwent hysterectomy September 2024; has been low on physical activity since then. Cortisone shot done to left knee, no change in pain.   PERTINENT HISTORY:  N/a  PAIN:   Are you having pain? Yes: NPRS scale: 5/10 Pain location: left knee Pain description: sharp Aggravating factors: walking, bending  Relieving factors: rest   PRECAUTIONS: None  RED FLAGS: None   WEIGHT BEARING RESTRICTIONS: No  FALLS:  Has patient fallen in last 6 months? No   OCCUPATION: retired; Media planner   PLOF: Independent  PATIENT GOALS: To return to kickboxing  NEXT MD VISIT: TBD    OBJECTIVE:   DIAGNOSTIC FINDINGS:  Left knee  IMPRESSION: 1. Tricompartmental osteoarthritis, most pronounced within the medial compartment. 2. 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 3.  Prior ACL reconstruction. ACL graft is intact, although appears to demonstrate a degree of laxity. Correlate with physical exam. 4. Prior MCL repair without evidence of recent injury. 5. Small-moderate sized knee joint effusion.    PATIENT SURVEYS:  LEFS 30/80  SCREENING FOR RED FLAGS: Bowel or bladder incontinence: No Spinal tumors: No Cauda equina syndrome: No Compression fracture: No Abdominal aneurysm: No  COGNITION: Overall cognitive status: Within functional limits for tasks assessed  POSTURE: weight shift right      FUNCTIONAL TESTS:     GAIT ANALYSIS: Distance walked: 25 ft Assistive device utilized: None Level of assistance: Complete Independence Comments: Patient with moderate left lower extremity antalgia  SENSATION: WFL    LOWER EXTREMITY MMT:    MMT Right eval Left eval  Hip flexion    Hip extension    Hip abduction    Hip adduction    Hip internal rotation    Hip external rotation    Knee flexion 4/5 3-/5  Knee extension 4/5 3-/5  Ankle dorsiflexion    Ankle plantarflexion    Ankle inversion    Ankle eversion     (Blank rows = not tested)  LOWER EXTREMITY ROM:     Passive  Right eval Left eval  Hip flexion    Hip extension    Hip abduction    Hip adduction    Hip internal rotation    Hip external rotation    Knee flexion  0-90  Knee extension  (2)  Ankle dorsiflexion    Ankle plantarflexion    Ankle inversion    Ankle eversion     (Blank rows = not tested)  PALPATION: Mod tenderness to palpation left knee + moderate tenderness to palpation left pes anserine 05/11/23 INTEGUMENTARY   Left knee mid-patella circumferential measurement 54.5 cm      TODAY'S TREATMENT:  DATE:   05/22/23 HEP modification/demo/review;  see below  05/19/23 Supine Manual therapy to left knee: AP glides Gr 2-3, hamstring  manual stretch to improve knee extension PROM SAQs with 7.5# 2x10 Bilateral leg press machine/ heel raises with 50# 2x10 Single leg RDL's with Tidal Tank x10 to facilitate bending/lifting  Squats + heels elevated + Tidal Tank 2x10 to facilitate squatting      05/17/23 Supine  Manual therapy to left knee: AP glides Gr 2-3, hamstring manual stretch to improve knee extension PROM, left popliteal soft tissue mobilization   HS stretch with strap   Left quad sets 10x3-5" H  Left SAQS with 3# 2x10  Gait Training with right single point cane   Weighted sled pushes to facilitate normal gait      05/11/23 Supine  Manual therapy: soft tissue mobilization to medial left knee  SAQs with 5# 2x10  Partial SLRs with 1# 2x10  LAQs with 5# 2x10  04/25/23 PT Initial Eval   PATIENT EDUCATION:  Education details: pain management, activity modification  Person educated: Patient Education method: Explanation Education comprehension: verbalized understanding  HOME EXERCISE PROGRAM: Access Code: [email or web address] URL: [email or web address] Date: 05/22/2023 Prepared by: Seymour Bars  Set A) Exercises - [place] Deadlift With Barbell  - 2 x weekly - 3 sets - 10 reps - Single Leg Deadlift with Kettlebell  - 2 x weekly - 3 sets - 10 reps - Deep Squat with Heels on Half Foam Roll  - 2 x weekly - 3 sets - 10 reps - Standard Lunge  - 2 x weekly - 3 sets - 10 reps - Side Stepping with Resistance at Ankles  - 2 x weekly - 3 sets - 10 reps   Set B) Access Code: [email or web address] URL: [email or web address] Date: 05/22/2023 Prepared by: Seymour Bars  Exercises - Prone Quadriceps Stretch with Strap  - 3 x weekly - 10 reps - 10 seconds  hold - Short Arc Quad with Ankle Weight  - 3 x weekly - 3 sets - 10 reps - Small Range Straight Leg Raise  - 3 x weekly - 3 sets - 10 reps - Figure 4 Bridge  - 3 x weekly - 3 sets - 10 reps - Prone Hamstring Curl with Ankle Weight  - 3 x weekly - 3 sets  - 10 reps - Seated Long Arc Quad with Ankle Weight  - 3 x weekly - 3 sets - 10 reps   ASSESSMENT:  CLINICAL IMPRESSION: Today: Session focused on HEP review/modification/demo. PT created two HEP p;ans; Set A nad Set B. One HEP focuses on active range of motion of the knee while the other set is Therapeutic Activity based. Pt able to demo WELL with no increase in pain.  The patient will benefit from PT to address the limitations/impairments listed below to return to their prior level of function in the domains of activity and participation.   Patient is a 53 y.o. y.o. female who was seen today for physical therapy evaluation and treatment for left knee pain, troubles walking. Patient presents to PT with the following objective impairments: Abnormal gait, decreased activity tolerance, decreased endurance, difficulty walking, decreased ROM, decreased strength, increased edema, improper body mechanics, postural dysfunction, and pain. These impairments limit the patient in activities such as carrying, lifting, standing, squatting, stairs, locomotion level, and caring for others. These impairments also limit the patient in participation such as meal prep, cleaning, laundry, shopping, community activity, and yard work. The patient will benefit from PT  to address the limitations/impairments listed below to return to their prior level of function in the domains of activity and participation.    PERSONAL FACTORS:  PMH left ankle/ left knee surgery   are also affecting patient's functional outcome.   REHAB POTENTIAL: Good  CLINICAL DECISION MAKING: Stable/uncomplicated  EVALUATION COMPLEXITY: Low     GOALS: Goals reviewed with patient? No  SHORT TERM GOALS: Target date: 05/16/2023    Patient will score a >/= 38/80 on the LEFS    to demonstrate a Minimally Clinically Importance Difference (MCID) in ADL completion, home/community ambulation, and lifting/bending/squatting.  Baseline: Goal status:  INITIAL   2. Patient will be independent with a basic stretching/strengthening HEP  Baseline:  Goal status: INITIAL   LONG TERM GOALS: Target date: 06/06/2023    Patient will score a >/= 46/80 on the LEFS    to demonstrate a Minimally Clinically Importance Difference (MCID) in ADL completion, home/community ambulation, and lifting/bending/squatting. Baseline:  Goal status: INITIAL   2.  Patient will be independent with a comprehensive strengthening HEP  Baseline:  Goal status: INITIAL  3. Patient will be able to demonstrate knee flexion/ extension active range of motion to [facility] degrees to facilitate ADL completion, lifting, squatting, walking. Baseline:  Goal status: INITIAL    PLAN:  PT FREQUENCY: 1-2x/week  PT DURATION: 6 weeks  PLANNED INTERVENTIONS: 97110-Therapeutic exercises, 97530- Therapeutic activity, 97112- Neuromuscular re-education, 97535- Self Care, 16109- Manual therapy, [phone]- Gait training, Patient/Family education, Balance training, Stair training, Taping, Dry Needling, Joint mobilization, Joint manipulation, Cryotherapy, and Moist heat.  PLAN FOR NEXT SESSION: Progress lower extremity active range of motion/ strength as tolerated    Seymour Bars, PT 05/22/2023, 9:40 AM

## 2023-05-25 ENCOUNTER — Ambulatory Visit (HOSPITAL_COMMUNITY): Payer: 59

## 2023-05-25 DIAGNOSIS — R262 Difficulty in walking, not elsewhere classified: Secondary | ICD-10-CM

## 2023-05-25 DIAGNOSIS — M25562 Pain in left knee: Secondary | ICD-10-CM

## 2023-05-25 DIAGNOSIS — M6281 Muscle weakness (generalized): Secondary | ICD-10-CM

## 2023-05-25 NOTE — Therapy (Signed)
[name] OUTPATIENT PHYSICAL THERAPY(LOWER EXTREMITY)   Patient Name: Kimberly Good MRN: [id number] DOB:01/24/1971, 52 y.o., female Today's Date: 05/25/2023  END OF SESSION:   PT End of Session - 05/25/23 1023     Visit Number 6    Number of Visits 8    Date for PT Re-Evaluation 06/06/23    Authorization Type [employer] NAP    Authorization Time Period no auth req    Progress Note Due on Visit 10    PT Start Time 0930    PT Stop Time 1010    PT Time Calculation (min) 40 min    Activity Tolerance Patient tolerated treatment well    Behavior During Therapy WFL for tasks assessed/performed              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   S/P eye surgery 10/19/2017   Tear of lateral cartilage or meniscus of knee, current 02/07/2012   Past Surgical History:  Procedure Laterality Date   ABDOMINOPLASTY     ANTERIOR CRUCIATE LIGAMENT REPAIR Left 2013   BREAST ENHANCEMENT SURGERY     COLONOSCOPY WITH PROPOFOL N/A 10/03/2022   Procedure: COLONOSCOPY WITH PROPOFOL;  Surgeon: Toney Reil, MD;  Location: [facility];  Service: Gastroenterology;  Laterality: N/A;   COSMETIC SURGERY     arm and thigh lift   CYSTOSCOPY N/A 01/19/2023   Procedure: CYSTOSCOPY;  Surgeon: Christeen Douglas, MD;  Location: [facility];  Service: Gynecology;  Laterality: N/A;   EYE SURGERY Bilateral    FRACTURE SURGERY Left 05/05/2022   Foot   ROBOTIC ASSISTED TOTAL HYSTERECTOMY WITH BILATERAL SALPINGO OOPHERECTOMY Right 01/19/2023   Procedure: XI ROBOTIC ASSISTED TOTAL HYSTERECTOMY WITH BILATERAL SALPINGECTOMY AND RIGHT OOPHORECTOMY;  Surgeon: Christeen Douglas, MD;  Location: [facility];  Service: Gynecology;  Laterality: Right;  RNFA NEEDED   TONSILLECTOMY     Patient Active  Problem List   Diagnosis Date Noted   Annual physical exam 01/03/2023   Bilateral leg edema 08/23/2022   Healthcare maintenance 08/23/2022   Arthritis of left midfoot 01/28/2022   Diplopia 07/31/2017   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    Eval: Patient had ACL/MCL surgery on left knee 2013; left ankle fracture March 2022 which was addressed with surgery Jan 2024- midfoot lisfranc fusion- multiple joints. Patient underwent hysterectomy September 2024; has been low on physical activity since then and left knee pain increased. Cortisone shot done to left knee, no change in pain.   PERTINENT HISTORY:  N/a  PAIN:   Are you having pain? Yes: NPRS scale: 5/10 Pain location: left knee Pain description: sharp Aggravating factors: walking, bending  Relieving factors: rest   PRECAUTIONS: None  RED FLAGS: None   WEIGHT BEARING RESTRICTIONS: No  FALLS:  Has patient fallen in last 6 months? No   OCCUPATION: retired; Media planner   PLOF: Independent  PATIENT GOALS: To return to kickboxing  NEXT MD VISIT: TBD    OBJECTIVE:   DIAGNOSTIC FINDINGS:  Left knee MRI 02/26/23  IMPRESSION: 1. Tricompartmental osteoarthritis, most pronounced within  the medial compartment. 2. 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 3. Prior ACL reconstruction. ACL graft is intact, although appears to demonstrate a degree of laxity. Correlate with physical exam. 4. Prior MCL repair without evidence of recent injury. 5. Small-moderate sized knee joint effusion.    PATIENT SURVEYS:  LEFS 30/80  SCREENING FOR RED FLAGS: Bowel or bladder incontinence: No Spinal tumors: No Cauda equina syndrome: No Compression fracture: No Abdominal aneurysm: No  COGNITION: Overall cognitive status: Within functional limits for tasks assessed  POSTURE: weight shift right      FUNCTIONAL TESTS:     GAIT ANALYSIS: Distance walked: 25 ft Assistive device utilized: None Level of assistance: Complete Independence Comments: Patient with moderate left lower extremity antalgia  SENSATION: WFL    LOWER EXTREMITY MMT:    MMT Right eval Left eval  Hip flexion    Hip extension    Hip abduction    Hip adduction    Hip internal rotation    Hip external rotation    Knee flexion 4/5 3-/5  Knee extension 4/5 3-/5  Ankle dorsiflexion    Ankle plantarflexion    Ankle inversion    Ankle eversion     (Blank rows = not tested)  LOWER EXTREMITY ROM:     Passive  Right eval Left eval  Hip flexion    Hip extension    Hip abduction    Hip adduction    Hip internal rotation    Hip external rotation    Knee flexion  0-90  Knee extension  (2)  Ankle dorsiflexion    Ankle plantarflexion    Ankle inversion    Ankle eversion     (Blank rows = not tested)  PALPATION: Mod tenderness to palpation left knee + moderate tenderness to palpation left pes anserine 05/11/23 INTEGUMENTARY   Left knee mid-patella circumferential measurement 54.5 cm      TODAY'S TREATMENT:  DATE:   05/25/23 HEP review Standing  Education/  training on proper lunge technique  Floor to mat to standing transfer through 1/2 kneel  05/22/23 HEP modification/demo/review;  see below  05/19/23 Supine Manual therapy to left knee: AP glides Gr 2-3, hamstring manual stretch to improve knee extension PROM SAQs with 7.5# 2x10 Bilateral leg press machine/ heel raises with 50# 2x10 Single leg RDL's with Tidal Tank x10 to facilitate bending/lifting  Squats + heels elevated + Tidal Tank 2x10 to facilitate squatting      05/17/23 Supine  Manual therapy to left knee: AP glides Gr 2-3, hamstring manual stretch to improve knee extension PROM, left popliteal soft tissue mobilization   HS stretch with strap   Left quad sets 10x3-5" H  Left SAQS with 3# 2x10  Gait Training with right single point cane   Weighted sled pushes to facilitate normal gait      05/11/23 Supine  Manual therapy: soft tissue mobilization to medial left knee  SAQs with 5# 2x10  Partial SLRs with 1# 2x10  LAQs with 5# 2x10  04/25/23 PT Initial Eval   PATIENT EDUCATION:  Education details: pain management, activity modification  Person educated: Patient Education method: Explanation Education comprehension: verbalized understanding  HOME EXERCISE PROGRAM: Access Code: [email or web address] URL: [email or web address] Date: 05/22/2023 Prepared by: Seymour Bars  Set A) Exercises(Functional) - [place] Deadlift With Barbell  - 2 x weekly - 3 sets - 10 reps - Single Leg Deadlift with Kettlebell  - 2 x weekly - 3 sets - 10 reps - Deep Squat with Heels on Half Foam Roll  - 2 x weekly - 3 sets - 10 reps - Standard Lunge  - 2 x weekly - 3 sets - 10 reps - Side Stepping with Resistance at Ankles  - 2 x weekly - 3 sets - 10 reps   Access Code: [email or web address] URL: [email or web address] Date: 05/22/2023 Prepared by: Seymour Bars  Set B) Exercises(Active range of motion) - Prone Quadriceps Stretch with Strap  - 3 x weekly - 10 reps - 10 seconds   hold - Short Arc Quad with Ankle Weight  - 3 x weekly - 3 sets - 10 reps - Small Range Straight Leg Raise  - 3 x weekly - 3 sets - 10 reps - Figure 4 Bridge  - 3 x weekly - 3 sets - 10 reps - Prone Hamstring Curl with Ankle Weight  - 3 x weekly - 3 sets - 10 reps - Seated Long Arc Quad with Ankle Weight  - 3 x weekly - 3 sets - 10 reps   ASSESSMENT:  CLINICAL IMPRESSION: Today: Patient has been able to maintain left knee extension active range of motion between sessions thus far; has been compliant with both sets of her HEP. Patient had question about proper lunge technique; PT demo'd proper lunge progression with no pain. Pt also inquired about how to get up from the floor. PT demo'd properly Fall Recovery from floor to standing transition; pt able to tolerate no pain.  The patient will benefit from PT to address the limitations/impairments listed below to return to their prior level of function in the domains of activity and participation.   Patient is a 53 y.o. y.o. female who was seen today for physical therapy evaluation and treatment for left knee pain, troubles walking. Patient presents to PT with the following objective impairments: Abnormal gait, decreased activity tolerance, decreased endurance, difficulty walking, decreased ROM, decreased strength, increased edema, improper body mechanics, postural  dysfunction, and pain. These impairments limit the patient in activities such as carrying, lifting, standing, squatting, stairs, locomotion level, and caring for others. These impairments also limit the patient in participation such as meal prep, cleaning, laundry, shopping, community activity, and yard work. The patient will benefit from PT to address the limitations/impairments listed below to return to their prior level of function in the domains of activity and participation.    PERSONAL FACTORS:  PMH left ankle/ left knee surgery   are also affecting patient's functional outcome.   REHAB  POTENTIAL: Good  CLINICAL DECISION MAKING: Stable/uncomplicated  EVALUATION COMPLEXITY: Low     GOALS: Goals reviewed with patient? No  SHORT TERM GOALS: Target date: 05/16/2023    Patient will score a >/= 38/80 on the LEFS    to demonstrate a Minimally Clinically Importance Difference (MCID) in ADL completion, home/community ambulation, and lifting/bending/squatting.  Baseline: Goal status: INITIAL   2. Patient will be independent with a basic stretching/strengthening HEP  Baseline:  Goal status: INITIAL   LONG TERM GOALS: Target date: 06/06/2023    Patient will score a >/= 46/80 on the LEFS    to demonstrate a Minimally Clinically Importance Difference (MCID) in ADL completion, home/community ambulation, and lifting/bending/squatting. Baseline:  Goal status: INITIAL   2.  Patient will be independent with a comprehensive strengthening HEP  Baseline:  Goal status: INITIAL  3. Patient will be able to demonstrate knee flexion/ extension active range of motion to [facility] degrees to facilitate ADL completion, lifting, squatting, walking. Baseline:  Goal status: INITIAL    PLAN:  PT FREQUENCY: 1-2x/week  PT DURATION: 6 weeks  PLANNED INTERVENTIONS: 97110-Therapeutic exercises, 97530- Therapeutic activity, 97112- Neuromuscular re-education, 97535- Self Care, 96045- Manual therapy, [phone]- Gait training, Patient/Family education, Balance training, Stair training, Taping, Dry Needling, Joint mobilization, Joint manipulation, Cryotherapy, and Moist heat.  PLAN FOR NEXT SESSION: Focus on stretching/ ROM  left knee to ensure full knee extension first; if active range of motion is [facility], begin balance/ neuromuscular re-education exercises   Seymour Bars, PT 05/25/2023, 10:25 AM

## 2023-06-01 ENCOUNTER — Encounter (HOSPITAL_COMMUNITY): Payer: 59

## 2023-06-08 ENCOUNTER — Ambulatory Visit (HOSPITAL_COMMUNITY): Payer: 59 | Attending: Orthopedic Surgery

## 2023-06-08 ENCOUNTER — Encounter (HOSPITAL_COMMUNITY): Payer: Self-pay

## 2023-06-08 DIAGNOSIS — M6281 Muscle weakness (generalized): Secondary | ICD-10-CM | POA: Diagnosis present

## 2023-06-08 DIAGNOSIS — R262 Difficulty in walking, not elsewhere classified: Secondary | ICD-10-CM | POA: Insufficient documentation

## 2023-06-08 DIAGNOSIS — M25562 Pain in left knee: Secondary | ICD-10-CM | POA: Diagnosis present

## 2023-06-08 NOTE — Therapy (Signed)
[name] OUTPATIENT PHYSICAL THERAPY(LOWER EXTREMITY)   Patient Name: Kimberly Good MRN: [id number] DOB:1970/07/07, 53 y.o., female Today's Date: 06/08/2023  END OF SESSION:   PT End of Session - 06/08/23 0938     Visit Number 7    Number of Visits 11    Date for PT Re-Evaluation 07/06/23    Authorization Type [employer] NAP    Authorization Time Period no auth req    Progress Note Due on Visit 10    PT Start Time [phone]    PT Stop Time 0930    PT Time Calculation (min) 43 min    Activity Tolerance Patient tolerated treatment well    Behavior During Therapy WFL for tasks assessed/performed              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   S/P eye surgery 10/19/2017   Tear of lateral cartilage or meniscus of knee, current 02/07/2012   Past Surgical History:  Procedure Laterality Date   ABDOMINOPLASTY     ANTERIOR CRUCIATE LIGAMENT REPAIR Left 2013   BREAST ENHANCEMENT SURGERY     COLONOSCOPY WITH PROPOFOL N/A 10/03/2022   Procedure: COLONOSCOPY WITH PROPOFOL;  Surgeon: Toney Reil, MD;  Location: [facility];  Service: Gastroenterology;  Laterality: N/A;   COSMETIC SURGERY     arm and thigh lift   CYSTOSCOPY N/A 01/19/2023   Procedure: CYSTOSCOPY;  Surgeon: Christeen Douglas, MD;  Location: [facility];  Service: Gynecology;  Laterality: N/A;   EYE SURGERY Bilateral    FRACTURE SURGERY Left 05/05/2022   Foot   ROBOTIC ASSISTED TOTAL HYSTERECTOMY WITH BILATERAL SALPINGO OOPHERECTOMY Right 01/19/2023   Procedure: XI ROBOTIC ASSISTED TOTAL HYSTERECTOMY WITH BILATERAL SALPINGECTOMY AND RIGHT OOPHORECTOMY;  Surgeon: Christeen Douglas, MD;  Location: [facility];  Service: Gynecology;  Laterality: Right;  RNFA NEEDED   TONSILLECTOMY     Patient Active  Problem List   Diagnosis Date Noted   Annual physical exam 01/03/2023   Bilateral leg edema 08/23/2022   Healthcare maintenance 08/23/2022   Arthritis of left midfoot 01/28/2022   Diplopia 07/31/2017   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  Pt reporting significant improvements and cannot wait to go back to MD to report progress. Pt reporting overall 0/10, mild stiffness but PT has helped significantly thus far.   Eval: Patient had ACL/MCL surgery on left knee 2013; left ankle fracture March 2022 which was addressed with surgery Jan 2024- midfoot lisfranc fusion- multiple joints. Patient underwent hysterectomy September 2024; has been low on physical activity since then and left knee pain increased. Cortisone shot done to left knee, no change in pain.   PERTINENT HISTORY:  N/a  PAIN:   Are you having pain? Yes: NPRS scale: 5/10 Pain location: left knee Pain description: sharp Aggravating factors: walking, bending  Relieving factors: rest   PRECAUTIONS: None  RED FLAGS: None   WEIGHT BEARING RESTRICTIONS: No  FALLS:  Has patient fallen in last 6 months? No   OCCUPATION: retired; Media planner   PLOF: Independent  PATIENT GOALS: To return to kickboxing  NEXT  MD VISIT: TBD    OBJECTIVE:   DIAGNOSTIC FINDINGS:  Left knee MRI 02/26/23  IMPRESSION: 1. Tricompartmental osteoarthritis, most pronounced within the medial compartment. 2. 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 3. Prior ACL reconstruction. ACL graft is intact, although appears to demonstrate a degree of laxity. Correlate with physical exam. 4. Prior MCL repair without evidence of recent injury. 5. Small-moderate sized knee joint effusion.    PATIENT SURVEYS:  LEFS 30/80  SCREENING FOR RED FLAGS: Bowel or bladder incontinence: No Spinal tumors: No Cauda equina syndrome: No Compression fracture: No Abdominal aneurysm: No  COGNITION: Overall cognitive status: Within functional limits for tasks assessed  POSTURE: weight shift right      FUNCTIONAL TESTS:     GAIT ANALYSIS: Distance walked: 25 ft Assistive device utilized: None Level of assistance: Complete Independence Comments: Patient with moderate left lower extremity antalgia  SENSATION: [facility]    LOWER EXTREMITY MMT:    MMT Right eval Left eval 06/08/2023 Right 06/08/2023 Left  Hip flexion      Hip extension      Hip abduction      Hip adduction      Hip internal rotation      Hip external rotation      Knee flexion 4/5 3-/5 4+/5 4/5  Knee extension 4/5 3-/5 4+/5 4-/5  Ankle dorsiflexion      Ankle plantarflexion      Ankle inversion      Ankle eversion       (Blank rows = not tested)  LOWER EXTREMITY ROM:     Passive  Right eval Left eval 06/08/2023  Left  Hip flexion     Hip extension     Hip abduction     Hip adduction     Hip internal rotation     Hip external rotation     Knee flexion  0-90 135  Knee extension  (2) 0  Ankle dorsiflexion     Ankle plantarflexion     Ankle inversion     Ankle eversion      (Blank rows = not tested)  PALPATION: Mod tenderness to palpation left knee + moderate tenderness to palpation left pes anserine 05/11/23 INTEGUMENTARY   Left  knee mid-patella circumferential measurement 54.5 cm      TODAY'S TREATMENT:  DATE:  06/08/2023  -ROM -MMT -LEFS 65/80 -Single leg balance on LLE x 1 with BUE  -half kneel/lunge from 2 in box to runners pose with single UE support -Half moons x 45' with GTB  05/25/23 HEP review Standing  Education/ training on proper lunge technique  Floor to mat to standing transfer through 1/2 kneel  05/22/23 HEP modification/demo/review;  see below  05/19/23 Supine Manual therapy to left knee: AP glides Gr 2-3, hamstring manual stretch to improve knee extension PROM SAQs with 7.5# 2x10 Bilateral leg press machine/ heel raises with 50# 2x10 Single leg RDL's with Tidal Tank x10 to facilitate bending/lifting  Squats + heels elevated + Tidal Tank 2x10 to facilitate squatting        PATIENT EDUCATION:  Education details: pain management, activity modification  Person educated: Patient Education method: Explanation Education comprehension: verbalized understanding  HOME EXERCISE PROGRAM: Access Code: [email or web address] URL: [email or web address] Date: 05/22/2023 Prepared by: Seymour Bars  Set A) Exercises(Functional) - [place] Deadlift With Barbell  - 2 x weekly - 3 sets - 10 reps - Single Leg Deadlift with Kettlebell  - 2 x weekly - 3 sets - 10 reps - Deep Squat with Heels on Half Foam Roll  - 2 x weekly - 3 sets - 10 reps - Standard Lunge  - 2 x weekly - 3 sets - 10 reps - Side Stepping with Resistance at Ankles  - 2 x weekly - 3 sets - 10 reps   Access Code: [email or web address] URL: [email or web address] Date: 05/22/2023 Prepared by: Seymour Bars  Set B) Exercises(Active range of motion) - Prone Quadriceps Stretch with Strap  - 3 x weekly - 10 reps - 10 seconds  hold - Short Arc Quad with Ankle Weight  - 3 x weekly - 3 sets - 10 reps - Small  Range Straight Leg Raise  - 3 x weekly - 3 sets - 10 reps - Figure 4 Bridge  - 3 x weekly - 3 sets - 10 reps - Prone Hamstring Curl with Ankle Weight  - 3 x weekly - 3 sets - 10 reps - Seated Long Arc Quad with Ankle Weight  - 3 x weekly - 3 sets - 10 reps   ASSESSMENT:  CLINICAL IMPRESSION: Today: Pt tolerating re-evaluation well. Pt making significant progress with goals and no pain currently. Pt showing significant improvements in muscle strength and and reported outcomes. Pt does show mild limitations in strength still and for this reason, we will request 1x/4 weeks for continued therapy to advance HEP and dynamic strengthening through new ROM. Pt with continued limitations in transferring from floor to stand. New goal added as well for finalized strengthening.  The patient will benefit from PT to address the limitations/impairments listed below to return to their prior level of function in the domains of activity and participation.   Patient is a 53 y.o. y.o. female who was seen today for physical therapy evaluation and treatment for left knee pain, troubles walking. Patient presents to PT with the following objective impairments: Abnormal gait, decreased activity tolerance, decreased endurance, difficulty walking, decreased ROM, decreased strength, increased edema, improper body mechanics, postural dysfunction, and pain. These impairments limit the patient in activities such as carrying, lifting, standing, squatting, stairs, locomotion level, and caring for others. These impairments also limit the patient in participation such as meal prep, cleaning, laundry, shopping, community activity, and yard work. The patient will benefit from PT to address the limitations/impairments listed below to return to their prior level  of function in the domains of activity and participation.    PERSONAL FACTORS:  PMH left ankle/ left knee surgery   are also affecting patient's functional outcome.   REHAB  POTENTIAL: Good  CLINICAL DECISION MAKING: Stable/uncomplicated  EVALUATION COMPLEXITY: Low     GOALS: Goals reviewed with patient? No  SHORT TERM GOALS: Target date: 05/16/2023    Patient will score a >/= 38/80 on the LEFS    to demonstrate a Minimally Clinically Importance Difference (MCID) in ADL completion, home/community ambulation, and lifting/bending/squatting.  Baseline: Goal status: MET   2. Patient will be independent with a basic stretching/strengthening HEP  Baseline:  Goal status: MET   LONG TERM GOALS: Target date: 06/06/2023    Patient will score a >/= 46/80 on the LEFS    to demonstrate a Minimally Clinically Importance Difference (MCID) in ADL completion, home/community ambulation, and lifting/bending/squatting. Baseline:  Goal status: MET   2.  Patient will be independent with a comprehensive strengthening HEP  Baseline:  Goal status: IN PROGRESS  3. Patient will be able to demonstrate knee flexion/ extension active range of motion to [facility] degrees to facilitate ADL completion, lifting, squatting, walking. Baseline:  Goal status: MET  4.  Pt will improve L Knee MMT by equal to R MMT in order to improve capacity for dynamic loading and symmetry during functional activities.. Baseline: See objective.  Goal status: INITIAL  5.  Pt will report 30-60 seconds of dynamic functional recreational activity with 0/10 reported pain to demonstrate return to desire activities with pain free motion in L knee.  Baseline: See objective. `  Goal status: INITIAL   PLAN:  PT FREQUENCY: 1x/week  PT DURATION: 4 weeks  PLANNED INTERVENTIONS: 97164- PT Re-evaluation, 97110-Therapeutic exercises, 97530- Therapeutic activity, 97112- Neuromuscular re-education, 97535- Self Care, 16109- Manual therapy, [phone]- Gait training, Patient/Family education, Balance training, Stair training, Taping, Dry Needling, Joint mobilization, Joint manipulation, Cryotherapy, and Moist  heat.  PLAN FOR NEXT SESSION: strengthening under ROM and loading through ROM, balance activities for LLE strengthening and stability  Elie Goody, DPT [facility] [place] [address] [phone] office   Nelida Meuse, PT 06/08/2023, 9:39 AM

## 2023-06-13 ENCOUNTER — Encounter (HOSPITAL_COMMUNITY): Payer: 59

## 2023-06-20 ENCOUNTER — Encounter (HOSPITAL_COMMUNITY): Payer: 59

## 2023-06-28 ENCOUNTER — Encounter (HOSPITAL_COMMUNITY): Payer: 59

## 2023-07-04 ENCOUNTER — Encounter (HOSPITAL_COMMUNITY): Payer: 59

## 2023-07-12 NOTE — Therapy (Unsigned)
 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   S/P eye surgery 10/19/2017   Tear of lateral cartilage or meniscus of knee, current 02/07/2012   Past Surgical History:  Procedure Laterality Date   ABDOMINOPLASTY     ANTERIOR CRUCIATE LIGAMENT REPAIR Left 2013   BREAST ENHANCEMENT SURGERY     COLONOSCOPY WITH PROPOFOL N/A 10/03/2022   Procedure: COLONOSCOPY WITH PROPOFOL;  Surgeon: Toney Reil, MD;  Location: [facility];  Service: Gastroenterology;  Laterality: N/A;   COSMETIC SURGERY     arm and thigh lift   CYSTOSCOPY N/A 01/19/2023   Procedure: CYSTOSCOPY;  Surgeon: Christeen Douglas, MD;  Location: [facility];  Service: Gynecology;  Laterality: N/A;   EYE SURGERY Bilateral    FRACTURE SURGERY Left 05/05/2022   Foot   ROBOTIC ASSISTED TOTAL HYSTERECTOMY WITH BILATERAL SALPINGO OOPHERECTOMY Right 01/19/2023   Procedure: XI ROBOTIC ASSISTED TOTAL HYSTERECTOMY WITH BILATERAL SALPINGECTOMY AND RIGHT OOPHORECTOMY;  Surgeon: Christeen Douglas, MD;  Location: [facility];  Service: Gynecology;  Laterality: Right;  RNFA NEEDED   TONSILLECTOMY     Patient Active Problem List   Diagnosis Date Noted   Annual physical exam 01/03/2023   Bilateral leg edema 08/23/2022   Healthcare maintenance 08/23/2022    Arthritis of left midfoot 01/28/2022   Diplopia 07/31/2017   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   Rationale for Evaluation and Treatment: Rehabilitation  ONSET DATE: 05/18/22    SUBJECTIVE STATEMENT: Pt states that she had a very busy year with a hysterectomy in September, and  fx her left ankle which needed a fusion, she had already had a ACL reconstruction years ago.  Her knee was bothering her severely so she went for knee rehab which was very success.  She states that she was at her MD last year when her MD noted that she had swollen ankles.  Then she notices that it feels like she has been slowly progressing.    She states that it has gotten to the point that on bad days she has tissue hanging over her knees and ankles.She states that she feels heavy.  She has been exercising, elevating but the swelling continues.  She tried knee hight compression garments but they were very uncomfortable.  She has a Administrator, sports but her legs just get bigger. She is active everyday using an exercise bike and LE exercises.    PERTINENT HISTORY: See  above  PAIN:  Are you having pain? No feels heavy   PRECAUTIONS: Other: cellulitis   WEIGHT BEARING RESTRICTIONS: No  FALLS:  Has patient fallen in last 6 months? No  LIVING ENVIRONMENT: Lives with: lives with their family  OCCUPATION: retired   PLOF: Independent  PATIENT GOALS: less swelling   NEXT MD VISIT: N/A  OBJECTIVE:  Note: Objective measures were completed at Evaluation unless otherwise noted.   LE LANDMARK RIGHT eval  At groin    30 cm proximal to suprapatella    20 cm proximal to suprapatella 75.8  10 cm proximal to suprapatella 68.7  At midpatella / popliteal crease 50.8  30 cm proximal to floor at  lateral plantar foot 52.2  20 cm proximal to floor at lateral plantar foot 40.8  10 cm proximal to floor at lateral plantar foot 27.6  Circumference of ankle/heel 33.7  5 cm proximal to 1st MTP joint 24.8  Across MTP joint 24.6  Around proximal great toe  9  (Blank rows = not tested)   LE LANDMARK LEFT eval  At groin    30 cm proximal to suprapatella    20 cm proximal to suprapatella 72  10 cm proximal to suprapatella 68  At midpatella / popliteal crease 56  30 cm proximal to floor at lateral plantar foot 50.8  20 cm proximal to floor at lateral plantar foot 38.3   10 cm proximal to floor at lateral plantar foot 28  Circumference of ankle/heel 33.7  5 cm proximal to 1st MTP joint 24.8  Across MTP joint 23.8  Around proximal great toe  8.7  (Blank rows = not tested)  POSTURE: No Significant postural limitations  PALPATION: Noted induration in Lt knee area   LOWER EXTREMITY ROM: WNL                                                                                                                                TREATMENT DATE: 07/13/23  Evaluation Measured for thigh high compression garments and urged to order them if she is not going to participate in total decongestive techniques.  PT   PATIENT EDUCATION:  Education details: What lymphedema is, lymphedema can not be cured, four aspects to controlling lymphedema and what therapy would look like if she chooses to complete total decongestive techniques. Urged to wear thigh high compression garments.  Person educated: Patient Education method: Explanation Education comprehension: verbalized understanding  HOME EXERCISE PROGRAM: Skin care, continue to exercise as pt is active.  Pool will be beneficial as pt has a pool and will open it once weather permits.    ASSESSMENT:  CLINICAL IMPRESSION: Patient is a 53 y.o. female who was seen today for physical therapy evaluation and treatment for B lymphedema.  She has been  exercising, elevating and attempted to use compression garments without success of reducing her edema.  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    OBJECTIVE IMPAIRMENTS: decreased activity tolerance, decreased mobility, increased edema, obesity, and pain.   ACTIVITY LIMITATIONS: standing, stairs, dressing, hygiene/grooming, and locomotion level  REHAB POTENTIAL: Good  CLINICAL DECISION MAKING: Evolving/moderate complexity  EVALUATION COMPLEXITY: Moderate   GOALS: Goals reviewed with patient? No  SHORT TERM GOALS: Target date: 08/11/23 Pt to have decreased 3 cm in measurements in both LE excluding feet to reduce the risk of cellulitis.  Baseline: Goal status: INITIAL  2.  Pt to be I in self manual techniques.  Baseline:  Goal status: INITIAL   LONG TERM GOALS: Target date: 08/25/23  Pt to have decreased 5-6 cm in measurements in both LE excluding feet to reduce the risk of cellulitis. Baseline:  Goal status: INITIAL  2.  Pt to be able to don a compression garment for maintenance Baseline:  Goal status: INITIAL  3.  Pt to be able to self bandage to control exacerbations Baseline:  Goal status: INITIAL  4.  PT to have and be using a compression pump  Baseline:  Goal status: INITIAL    PLAN:  PT FREQUENCY: 3x/week  PT DURATION: 6 weeks  PLANNED INTERVENTIONS: 97535- Self Care and 16109- Manual therapy  PLAN FOR NEXT SESSION: cut foam and begin total decongestive bandaging   Virgina Organ, PT CLT [phone]  07/13/2023, 2:58 PM

## 2023-07-13 ENCOUNTER — Ambulatory Visit (HOSPITAL_COMMUNITY): Attending: Family Medicine | Admitting: Physical Therapy

## 2023-07-13 ENCOUNTER — Other Ambulatory Visit: Payer: Self-pay

## 2023-07-13 DIAGNOSIS — R600 Localized edema: Secondary | ICD-10-CM | POA: Insufficient documentation

## 2023-07-19 ENCOUNTER — Encounter: Payer: Self-pay | Admitting: Family Medicine

## 2023-07-19 DIAGNOSIS — I83891 Varicose veins of right lower extremities with other complications: Secondary | ICD-10-CM

## 2023-07-19 DIAGNOSIS — I83811 Varicose veins of right lower extremities with pain: Secondary | ICD-10-CM

## 2023-07-19 DIAGNOSIS — R600 Localized edema: Secondary | ICD-10-CM

## 2023-07-21 NOTE — Telephone Encounter (Signed)
Please see the message below and advise.

## 2023-07-24 ENCOUNTER — Encounter (HOSPITAL_COMMUNITY): Admitting: Physical Therapy

## 2023-07-24 NOTE — Addendum Note (Signed)
 Addended byJacquenette Shone on: 07/24/2023 01:02 PM   Modules accepted: Orders

## 2023-07-26 ENCOUNTER — Encounter (HOSPITAL_COMMUNITY): Admitting: Physical Therapy

## 2023-07-27 ENCOUNTER — Encounter (HOSPITAL_COMMUNITY): Payer: Self-pay | Admitting: Physical Therapy

## 2023-07-27 NOTE — Therapy (Unsigned)
 PHYSICAL THERAPY DISCHARGE SUMMARY  Visits from Start of Care: 1  Current functional level related to goals / functional outcomes: No progress made as pt did not return for treatment   Remaining deficits: See above   Education / Equipment: Four aspects for treating lymphedema   Patient agrees to discharge. Patient goals were not met. Patient is being discharged due to not returning since the last visit.   Virgina Organ, PT CLT [phone]

## 2023-07-28 ENCOUNTER — Encounter (HOSPITAL_COMMUNITY): Admitting: Physical Therapy

## 2023-07-31 ENCOUNTER — Ambulatory Visit: Payer: Self-pay | Admitting: Family Medicine

## 2023-07-31 ENCOUNTER — Encounter (HOSPITAL_COMMUNITY)

## 2023-07-31 NOTE — Telephone Encounter (Signed)
 Copied from CRM [id number]. Topic: Clinical - Red Word Triage >> Jul 31, 2023  8:58 AM Marlow Baars wrote: Red Word that prompted transfer to Nurse Triage: The patient called in stating she has been dealing with what she was told is lipidemia. She has swelling in both legs and arms. She says it is like her arms and legs are full of concrete and so very heavy. She said it is extremely uncomfortable. She has been referred to a Specialist at [facility] but is waiting to hear back from them concerning scheduling. She also questions if supplements she is taking are counteracting the fluid pill she is taking?I will transfer her to E2C2 NT   Reason for Disposition  MODERATE arm swelling (e.g., puffiness or swollen feeling of entire arm)  Answer Assessment - Initial Assessment Questions 1. ONSET: "When did the swelling start?" (e.g., minutes, hours, days)     Started 2 weeks ago  2. LOCATION: "What part of the arm is swollen?"  "Are both arms swollen or just one arm?"    Both arms, from wrist to armpit  3. SEVERITY: "How bad is the swelling?" (e.g., localized; mild, moderate, severe)   - LOCALIZED: Small area of puffiness or swelling on just one arm   - JOINT SWELLING: Swelling of one joint   - MILD: Puffiness or swelling of hand   - MODERATE: Puffiness or swollen feeling of entire arm    - SEVERE: All of arm looks swollen; pitting edema     Moderate to sever  4. REDNESS: "Does the swelling look red or infected?"     No  5. PAIN: "Is the swelling painful to touch?" If Yes, ask: "How painful is it?"   (Scale 1-10; mild, moderate or severe)     No  6. FEVER: "Do you have a fever?" If Yes, ask: "What is it, how was it measured, and when did it start?"      No  7. CAUSE: "What do you think is causing the arm swelling?"     Unsure of cause  8. MEDICAL HISTORY: "Do you have a history of heart failure, kidney disease, liver failure, or cancer?"     No  9. RECURRENT SYMPTOM: "Have you had arm  swelling before?" If Yes, ask: "When was the last time?" "What happened that time?"     No  10. OTHER SYMPTOMS: "Do you have any other symptoms?" (e.g., chest pain, difficulty breathing)      :Leg swelling  11. PREGNANCY: "Is there any chance you are pregnant?" "When was your last menstrual period?"       No  Protocols used: Arm Swelling and Edema-A-AH

## 2023-07-31 NOTE — Telephone Encounter (Signed)
 Reviewed. Will evaluate patient as scheduled on 08/01/23

## 2023-07-31 NOTE — Telephone Encounter (Signed)
Please see the message below and advise.

## 2023-08-01 ENCOUNTER — Encounter: Payer: Self-pay | Admitting: Family Medicine

## 2023-08-01 ENCOUNTER — Ambulatory Visit: Admitting: Family Medicine

## 2023-08-01 VITALS — BP 99/70 | HR 100 | Ht 63.0 in | Wt 236.0 lb

## 2023-08-01 DIAGNOSIS — R600 Localized edema: Secondary | ICD-10-CM | POA: Diagnosis not present

## 2023-08-01 MED ORDER — FUROSEMIDE 20 MG PO TABS
20.0000 mg | ORAL_TABLET | Freq: Every day | ORAL | 3 refills | Status: DC
Start: 2023-08-01 — End: 2024-01-10

## 2023-08-01 NOTE — Progress Notes (Signed)
 Established patient visit   Patient: Kimberly Good   DOB: 07/10/1970   53 y.o. Female  MRN: [id number] Visit Date: 08/01/2023  Today's healthcare provider: Ronnald Ramp, MD   Chief Complaint  Patient presents with   Arm Swelling    Both arm swelling, this has been going on since her procedure back in September     Leg Swelling    Both legs arm swollen as well    Subjective     HPI     Arm Swelling    Additional comments: Both arm swelling, this has been going on since her procedure back in September          Leg Swelling    Additional comments: Both legs arm swollen as well       Last edited by Thedora Hinders, CMA on 08/01/2023  1:54 PM.       Discussed the use of AI scribe software for clinical note transcription with the patient, who gave verbal consent to proceed.  History of Present Illness Kimberly Good "[name]" is a 53 year old female who presents with concerns of bilateral upper and lower extremity swelling.  She experiences significant bilateral swelling in her upper and lower extremities, which fluctuates throughout the day. Her clothing fits differently at various times, and she describes her arms as having become larger and her legs as feeling heavy, particularly towards the end of the day. She has a history of brachioplasty and notes that her arms were previously tight, but now she cannot wear certain shirts that fit just weeks ago.  She has been on hydrochlorothiazide but is unsure if it has affected the swelling. She has also been using a fascia blaster and has noticed bruising as a result. A rehabilitation center advised her to stop taking her water pill. She is concerned about the possibility of having lymphedema or lipedema.  She has been actively working on weight loss, having lost 20 pounds, and is following a strict diet low in sodium and sugar. She consumes 500 to 800 calories a day, primarily from vegetables, fruits, fish,  chicken, and shrimp. She avoids dairy, bread, and sugar, and has increased her protein intake. She reports having more energy and no longer craves foods like ice cream.  She has a history of knee issues and was previously advised to undergo a total knee replacement, which she declined. She reports no current knee pain and attributes this to therapy and possibly the use of supplements like osteobiflex, which she started in December. She has been off these supplements for four days and is concerned about the potential return of knee pain.  Her family history includes her father having had congestive heart failure, which makes her concerned about her own fluid retention issues. She has a history of hysterectomy but retained her ovaries, and she notes changes in her body post-surgery, including feeling 'squishier and looser'. She is concerned about the impact of hormonal fluctuations on her weight and swelling.  No pitting edema typically associated with heart, kidney, or liver issues. No knee pain despite being advised to have a knee replacement. Her legs feel heavy by the end of the day.     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   Cyanocobalamin (VITAMIN B-12 PO) Take 1 tablet by mouth daily.   docusate sodium (COLACE) 100 MG capsule Take 1 capsule (100 mg total) by mouth 2 (two) times daily. To keep stools soft   hydrochlorothiazide (HYDRODIURIL) 25 MG tablet Take 1 tablet (25 mg total) by mouth every morning.   Multiple Vitamin (MULTIVITAMIN) tablet  Take 1 tablet by mouth daily.   Omega-3 Fatty Acids (FISH OIL PO) Take 2 tablets by mouth daily.   oxyCODONE (OXY IR/ROXICODONE) 5 MG immediate release tablet Take 1 tablet (5 mg total) by mouth every 4 (four) hours as needed for severe pain.   No facility-administered medications prior to visit.    Review of Systems  Last metabolic panel Lab Results  Component Value Date   GLUCOSE 110 (H) 01/12/2023   NA 139 01/12/2023   K 4.3 01/12/2023   CL 104 01/12/2023   CO2 20 01/12/2023   BUN 11 01/12/2023   CREATININE 0.75 01/12/2023   EGFR 96 01/12/2023   CALCIUM 9.7 01/12/2023   PROT 6.7 01/12/2023   ALBUMIN 4.3 01/12/2023   LABGLOB 2.4 01/12/2023   AGRATIO 2.0 08/23/2022   BILITOT <0.2 01/12/2023   ALKPHOS 87 01/12/2023   AST 9 01/12/2023   ALT 10 01/12/2023   ANIONGAP 10 12/04/2022   Last lipids Lab Results  Component Value Date   CHOL 229 (H) 01/12/2023   HDL 57 01/12/2023   LDLCALC 160 (H) 01/12/2023   TRIG 70 01/12/2023   CHOLHDL 4.0 01/12/2023   Last hemoglobin A1c Lab Results  Component Value Date   HGBA1C 5.6 01/12/2023   Last thyroid functions Lab Results  Component Value Date   TSH 0.398 (L) 01/12/2023        Objective    BP 99/70   Pulse 100   Ht 5\' 3"  (1.6 m)   Wt 236 lb (107 kg)   SpO2 98%   BMI 41.81 kg/m  BP Readings from Last 3 Encounters:  08/01/23 99/70  01/19/23 130/78  01/03/23 129/82   Wt Readings from Last 3 Encounters:  08/01/23 236 lb (107 kg)  03/06/23 256 lb (116.1 kg)  01/19/23 256 lb 13.4 oz (116.5 kg)        Physical Exam Vitals reviewed.  Constitutional:      General: She is not in acute distress.    Appearance: Normal appearance. She is not ill-appearing, toxic-appearing or diaphoretic.  Eyes:     Conjunctiva/sclera: Conjunctivae normal.  Cardiovascular:     Rate and Rhythm: Normal rate and regular rhythm.     Pulses: Normal pulses.     Heart sounds: Normal heart sounds. No murmur heard.    No friction rub.  No gallop.  Pulmonary:     Effort: Pulmonary effort is normal. No respiratory distress.     Breath sounds: Normal breath sounds. No stridor. No wheezing, rhonchi or rales.  Abdominal:     General: Bowel sounds are normal. There is no distension.     Palpations: Abdomen is soft.     Tenderness: There is no abdominal tenderness.  Musculoskeletal:     Right lower leg: Edema present.     Left lower leg: Edema present.  Comments: Swelling in bilateral upper extremities, non pitting, suspect lipedema change  Skin:    Findings: No erythema or rash.  Neurological:     Mental Status: She is alert and oriented to person, place, and time.  Psychiatric:        Mood and Affect: Mood and affect normal.        Speech: Speech normal.        Behavior: Behavior normal. Behavior is cooperative.     No results found for any visits on 08/01/23.   Assessment & Plan     Problem List Items Addressed This Visit   None Visit Diagnoses       Extremity edema    -  Primary   Relevant Medications   furosemide (LASIX) 20 MG tablet   Other Relevant Orders   CMP14+EGFR        Assessment & Plan Bilateral upper and lower extremity swelling Presents with bilateral upper and lower extremity swelling, fluctuating throughout the day, likely due to fluid accumulation and possible lymphedema or lipedema. Previous knee surgery and hysterectomy may contribute to swelling. Currently on hydrochlorothiazide. Significant weight loss may affect tissue appearance. Lasix, though potentially nephrotoxic and potassium-depleting, is considered for fluid management. - Prescribe Lasix 20 mg as needed for swelling. - Continue hydrochlorothiazide 25 mg. - Order complete metabolic panel to assess liver, renal function, and electrolytes. - Encourage resistance training and weight lifting to enhance muscle tone. - Refer to vein specialist at [facility] for further evaluation.  Knee osteoarthritis Previously advised total knee  replacement but declined due to concerns about future surgeries and prosthesis longevity. Reports no current knee pain, likely due to physical therapy and weight loss. Supplements such as Osteo Bi-Flex and B12 may have improved symptoms. Knee replacement is not currently necessary. - Continue current supplements for joint health. - Encourage continuation of physical therapy exercises.  Weight loss and dietary changes Significant weight loss achieved through a strict low-calorie, low-sodium diet, resulting in increased energy and reduced inflammation. Diet includes vegetables, fruits, fish, chicken, and shrimp, avoiding sugar, dairy, and bread. Reports no hunger and has adapted to smaller portions. - Encourage continuation of current dietary regimen. - Monitor for signs of nutritional deficiencies.  General Health Maintenance Engaged in health maintenance through dietary changes and weight loss. Considering surgical interventions for cosmetic reasons related to weight loss. Tissue changes may be due to weight loss rather than lymphedema or lipedema. - Encourage continuation of healthy lifestyle changes. - Discuss potential surgical options for cosmetic concerns if desired.  Follow-up Follow-up scheduled to assess Lasix effectiveness and overall health status. - Schedule follow-up appointment in two weeks to assess response to Lasix and overall health.     Return in about 3 weeks (around 08/22/2023) for leg edema .      Ronnald Ramp, MD  [facility] [phone] (phone) [phone] (fax)  [facility]

## 2023-08-02 ENCOUNTER — Encounter (HOSPITAL_COMMUNITY): Admitting: Physical Therapy

## 2023-08-02 ENCOUNTER — Encounter: Payer: Self-pay | Admitting: Family Medicine

## 2023-08-02 LAB — CMP14+EGFR
ALT: 12 IU/L (ref 0–32)
AST: 14 IU/L (ref 0–40)
Albumin: 4.8 g/dL (ref 3.8–4.9)
Alkaline Phosphatase: 89 IU/L (ref 44–121)
BUN/Creatinine Ratio: 20 (ref 9–23)
BUN: 18 mg/dL (ref 6–24)
Bilirubin Total: 0.6 mg/dL (ref 0.0–1.2)
CO2: 21 mmol/L (ref 20–29)
Calcium: 10.4 mg/dL — ABNORMAL HIGH (ref 8.7–10.2)
Chloride: 99 mmol/L (ref 96–106)
Creatinine, Ser: 0.9 mg/dL (ref 0.57–1.00)
Globulin, Total: 2.3 g/dL (ref 1.5–4.5)
Glucose: 89 mg/dL (ref 70–99)
Potassium: 4.1 mmol/L (ref 3.5–5.2)
Sodium: 139 mmol/L (ref 134–144)
Total Protein: 7.1 g/dL (ref 6.0–8.5)
eGFR: 77 mL/min/{1.73_m2} (ref >59)

## 2023-08-03 ENCOUNTER — Telehealth: Payer: Self-pay

## 2023-08-03 NOTE — Telephone Encounter (Signed)
 Copied from CRM [id number]. Topic: Clinical - Lab/Test Results >> Aug 03, 2023  8:15 AM [name] wrote: Reason for CRM: Patient calling for lab results. Relayed results per providers note verbatim. Patient verbalized understanding, no additional questions at this time.

## 2023-08-04 ENCOUNTER — Encounter (HOSPITAL_COMMUNITY): Admitting: Physical Therapy

## 2023-08-07 ENCOUNTER — Encounter: Payer: Self-pay | Admitting: Family Medicine

## 2023-08-07 ENCOUNTER — Encounter (HOSPITAL_COMMUNITY)

## 2023-08-07 DIAGNOSIS — Z5181 Encounter for therapeutic drug level monitoring: Secondary | ICD-10-CM

## 2023-08-07 DIAGNOSIS — R600 Localized edema: Secondary | ICD-10-CM

## 2023-08-07 NOTE — Telephone Encounter (Signed)
 Please see the message below.

## 2023-08-07 NOTE — Telephone Encounter (Signed)
 Please see the pt message below and advise

## 2023-08-09 ENCOUNTER — Encounter (HOSPITAL_COMMUNITY): Admitting: Physical Therapy

## 2023-08-11 ENCOUNTER — Encounter (HOSPITAL_COMMUNITY): Admitting: Physical Therapy

## 2023-08-14 ENCOUNTER — Encounter (HOSPITAL_COMMUNITY)

## 2023-08-16 ENCOUNTER — Encounter (HOSPITAL_COMMUNITY)

## 2023-08-18 ENCOUNTER — Encounter (HOSPITAL_COMMUNITY)

## 2023-08-21 ENCOUNTER — Encounter (HOSPITAL_COMMUNITY)

## 2023-08-23 ENCOUNTER — Encounter (HOSPITAL_COMMUNITY): Admitting: Physical Therapy

## 2023-08-25 ENCOUNTER — Encounter (HOSPITAL_COMMUNITY)

## 2023-08-28 ENCOUNTER — Encounter (HOSPITAL_COMMUNITY)

## 2023-08-30 ENCOUNTER — Encounter (HOSPITAL_COMMUNITY): Admitting: Physical Therapy

## 2023-09-01 ENCOUNTER — Encounter (HOSPITAL_COMMUNITY): Admitting: Physical Therapy

## 2023-09-05 NOTE — Telephone Encounter (Unsigned)
 Copied from CRM [id number]. Topic: Clinical - Request for Lab/Test Order >> Sep 05, 2023  1:22 PM Baldomero Bone wrote: Reason for CRM: Patient is no longer taking the furosemide  (LASIX ) 20 MG tablet because it was making her so dizzy that she was unable to drive. Patient cancelled her appointment for 09/06/2023 but she still wants to labs run. Please submit labs for patient so that she can schedule an appointment or come in for the blood draw. Callback number is [phone].

## 2023-09-05 NOTE — Telephone Encounter (Signed)
 Please see the message below.

## 2023-09-06 ENCOUNTER — Ambulatory Visit: Admitting: Family Medicine

## 2023-11-13 ENCOUNTER — Other Ambulatory Visit: Payer: Self-pay | Admitting: Family Medicine

## 2023-11-13 DIAGNOSIS — Z1231 Encounter for screening mammogram for malignant neoplasm of breast: Secondary | ICD-10-CM

## 2023-11-14 ENCOUNTER — Other Ambulatory Visit: Payer: Self-pay | Admitting: Family Medicine

## 2023-11-14 DIAGNOSIS — R600 Localized edema: Secondary | ICD-10-CM

## 2024-01-10 ENCOUNTER — Ambulatory Visit

## 2024-01-10 ENCOUNTER — Other Ambulatory Visit: Payer: Self-pay | Admitting: Family Medicine

## 2024-01-10 ENCOUNTER — Encounter: Payer: Self-pay | Admitting: Family Medicine

## 2024-01-10 ENCOUNTER — Ambulatory Visit (INDEPENDENT_AMBULATORY_CARE_PROVIDER_SITE_OTHER): Admitting: Family Medicine

## 2024-01-10 ENCOUNTER — Ambulatory Visit
Admission: RE | Admit: 2024-01-10 | Discharge: 2024-01-10 | Disposition: A | Discharge status: Home / Self Care | Source: Ambulatory Visit | Attending: Family Medicine | Admitting: Family Medicine

## 2024-01-10 VITALS — BP 133/77 | HR 76 | Temp 98.6°F | Ht 63.5 in | Wt 232.9 lb

## 2024-01-10 DIAGNOSIS — Z131 Encounter for screening for diabetes mellitus: Secondary | ICD-10-CM | POA: Diagnosis not present

## 2024-01-10 DIAGNOSIS — R600 Localized edema: Secondary | ICD-10-CM | POA: Diagnosis not present

## 2024-01-10 DIAGNOSIS — Z0000 Encounter for general adult medical examination without abnormal findings: Secondary | ICD-10-CM | POA: Diagnosis not present

## 2024-01-10 DIAGNOSIS — R7989 Other specified abnormal findings of blood chemistry: Secondary | ICD-10-CM

## 2024-01-10 DIAGNOSIS — I890 Lymphedema, not elsewhere classified: Secondary | ICD-10-CM | POA: Diagnosis not present

## 2024-01-10 DIAGNOSIS — Z130 Encounter for screening for diseases of the blood and blood-forming organs and certain disorders involving the immune mechanism: Secondary | ICD-10-CM

## 2024-01-10 DIAGNOSIS — R609 Edema, unspecified: Secondary | ICD-10-CM | POA: Insufficient documentation

## 2024-01-10 DIAGNOSIS — Z13220 Encounter for screening for lipoid disorders: Secondary | ICD-10-CM

## 2024-01-10 DIAGNOSIS — Z1231 Encounter for screening mammogram for malignant neoplasm of breast: Secondary | ICD-10-CM

## 2024-01-10 MED ORDER — METFORMIN HCL ER 500 MG PO TB24
500.0000 mg | ORAL_TABLET | Freq: Every day | ORAL | 3 refills | Status: DC
Start: 2024-01-10 — End: 2024-01-19

## 2024-01-10 NOTE — Assessment & Plan Note (Signed)
 Lymphedema of bilateral upper and lower extremities Diagnosed with lymphedema in arms and legs. Conservative treatment with hesperidin and diosmin has improved joint pain and swelling. Uses a pump daily for trunk and legs, wears compression garments, follows a Mediterranean diet, and exercises with a vibration plate and rebounder. Considering metformin  if conservative measures are insufficient, with potential side effects including diarrhea and need for kidney function monitoring. - Continue hesperidin and diosmin - Continue daily pump therapy for trunk and legs - Continue wearing compression garments - Continue Mediterranean diet - Continue exercises with vibration plate and rebounder - Consider metformin  500 mg once daily if conservative measures are insufficient - Monitor kidney function with metabolic panel - Follow up with specialist in four months

## 2024-01-10 NOTE — Progress Notes (Signed)
 Complete physical exam   Patient: Kimberly Good   DOB: 10-Aug-1970   53 y.o. Female  MRN: [id number] Visit Date: 01/10/2024  Today's healthcare provider: Rockie Agent, MD   Chief Complaint  Patient presents with   Annual Exam    Patient is present for annual exam. Following a mediterranean diet, still has dairy. Exercising by using vibration plate, re bounder and elliptical.   Patient is seeing dermatology for mole removals, seeing dr at [facility] for lymphedema.  Vaccines: Patient declines today, will try to get singles at a later date  Screenings: UTD, will get mammo today    Subjective    Kimberly Good is a 53 y.o. female who presents today for a complete physical exam.    She does have additional problems to discuss today.   Discussed the use of AI scribe software for clinical note transcription with the patient, who gave verbal consent to proceed.  History of Present Illness Kimberly Good is a 53 year old female who presents for an annual physical.  She has a history of arthritis with joint pain primarily in her knees. Since starting over-the-counter hesperidin and diosmin, she has experienced significant improvement in joint pain.  She has lymphedema affecting her arms and legs, with current treatment focusing on her legs. She describes the sensation as 'like a bunch of BBs' under her skin, causing discomfort and fatigue. Her treatment includes wearing compression garments, using a pump for an hour daily, and exercises such as using a vibration plate and rebounder, which have improved her symptoms.  She follows a Mediterranean diet and has lost weight, currently weighing 232 pounds, down from 256 pounds last November. She avoids complex carbohydrates and has shifted from a very low-calorie diet to a more balanced approach. She exercises regularly using a vibration plate, rebounder, and elliptical machines.  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 She also has a history of uterine fibroids.  She experiences bilateral leg edema and is under the care of a specialist at [facility] for her lymphedema. She is currently taking hesperidin and diosmin, which she finds beneficial for reducing swelling and joint pain.  She reports tinnitus over the past four months, described as a faint, constant ringing in her ears, more noticeable in quiet environments. No hearing loss or pulsatile tinnitus.     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   S/P eye surgery 10/19/2017   Tear of lateral cartilage or meniscus of knee, current 02/07/2012   Past Surgical History:  Procedure Laterality Date   ABDOMINOPLASTY     ANTERIOR CRUCIATE LIGAMENT REPAIR Left 2013   BREAST ENHANCEMENT SURGERY     COLONOSCOPY WITH PROPOFOL  N/A 10/03/2022   Procedure: COLONOSCOPY WITH PROPOFOL ;  Surgeon: Unk Corinn Skiff, MD;  Location: [facility];  Service: Gastroenterology;  Laterality: N/A;   COSMETIC SURGERY     arm and thigh lift   CYSTOSCOPY N/A 01/19/2023   Procedure: CYSTOSCOPY;  Surgeon: Verdon Keen, MD;  Location: [facility] ORS;  Service: Gynecology;  Laterality: N/A;   EYE SURGERY Bilateral    FRACTURE SURGERY Left  05/05/2022   Foot   ROBOTIC ASSISTED TOTAL HYSTERECTOMY WITH BILATERAL SALPINGO OOPHERECTOMY Right 01/19/2023   Procedure: XI ROBOTIC ASSISTED TOTAL HYSTERECTOMY WITH BILATERAL SALPINGECTOMY AND RIGHT OOPHORECTOMY;  Surgeon: Verdon Keen, MD;  Location: [facility] ORS;  Service: Gynecology;  Laterality: Right;  RNFA NEEDED   TONSILLECTOMY     Social History   Socioeconomic History   Marital status: Married    Spouse name:  Not on file   Number of children: Not on file   Years of education: Not on file   Highest education level: Not on file  Occupational History   Not on file  Tobacco Use   Smoking status: Never   Smokeless tobacco: Never  Vaping Use   Vaping status: Never Used  Substance and Sexual Activity   Alcohol use: Never   Drug use: Never   Sexual activity: Not on file  Other Topics Concern   Not on file  Social History Narrative   Not on file   Social Drivers of Health   Financial Resource Strain: Low Risk  (12/15/2023)   Received from [facility]   Overall Financial Resource Strain (CARDIA)    Difficulty of Paying Living Expenses: Not hard at all  Food Insecurity: No Food Insecurity (12/15/2023)   Received from [facility]   Hunger Vital Sign    Within the past 12 months, you worried that your food would run out before you got the money to buy more.: Never true    Within the past 12 months, the food you bought just didn't last and you didn't have money to get more.: Never true  Transportation Needs: No Transportation Needs (12/15/2023)   Received from [facility] - Transportation    In the past 12 months, has lack of transportation kept you from medical appointments or from getting medications?: No    Lack of Transportation (Non-Medical): No  Physical Activity: Not on file  Stress: Not on file  Social Connections: Unknown (05/05/2022)   Received from [facility]   Social Network    Social Network: Not on file  Intimate Partner Violence: Unknown (05/05/2022)   Received from [facility]   HITS    Physically Hurt: Not on file    Insult or Talk Down To: Not on file    Threaten Physical Harm: Not on file    Scream or Curse: Not on file   Family Status  Relation Name Status   Mother  Alive   Father  Alive   MGM  Deceased   MGF  Deceased   PGM  Deceased   Sib  Deceased   Neg Hx  (Not Specified)  No partnership  data on file   Family History  Problem Relation Age of Onset   High blood pressure Mother    High Cholesterol Mother    Diabetes type II Mother    High Cholesterol Father    Stroke Father    High blood pressure Father    Emphysema Father    Stroke Maternal Grandmother    High blood pressure Maternal Grandmother    Diabetes Maternal Grandfather    High blood pressure Paternal Grandmother    Multiple sclerosis Sibling    High blood pressure Sibling    Breast cancer Neg Hx    Allergies  Allergen Reactions   Prednisone Other (See Comments) and Swelling   Ciprofloxacin  Other (See Comments)    Swelling and pain in hands, diarrhea  Medications: Outpatient Medications Prior to Visit  Medication Sig Note   Multiple Vitamins-Minerals (HAIR SKIN AND NAILS FORMULA) TABS Take 1 tablet by mouth daily.    UNABLE TO FIND Take 1,000 mg by mouth daily. Med Name: Diosmin Hesperedin    [DISCONTINUED] ASHWAGANDHA PO Take 1 tablet by mouth daily. 01/10/2024: d/c by provider at [facility] Dr. Geerson   [DISCONTINUED] Cyanocobalamin (VITAMIN B-12 PO) Take 1 tablet by mouth daily. 01/10/2024: d/c by provider at [facility] Dr. Geerson   [DISCONTINUED] docusate sodium  (COLACE) 100 MG capsule Take 1 capsule (100 mg total) by mouth 2 (two) times daily. To keep stools soft 01/10/2024: d/c by provider at [facility] Dr. Geerson   [DISCONTINUED] furosemide  (LASIX ) 20 MG tablet Take 1 tablet (20 mg total) by mouth daily. 01/10/2024: d/c by provider at [facility] Dr. Geerson   [DISCONTINUED] hydrochlorothiazide  (HYDRODIURIL ) 25 MG tablet TAKE 1 TABLET BY MOUTH EVERY MORNING 01/10/2024: d/c by provider at [facility] Dr. Geerson   [DISCONTINUED] Multiple Vitamin (MULTIVITAMIN) tablet Take 1 tablet by mouth daily. 01/10/2024: d/c by provider at [facility] Dr. Geerson   [DISCONTINUED] Omega-3 Fatty Acids (FISH OIL PO) Take 2 tablets by mouth daily. 01/10/2024: d/c by provider at [facility] Dr. Geerson   [DISCONTINUED] oxyCODONE  (OXY IR/ROXICODONE ) 5 MG immediate  release tablet Take 1 tablet (5 mg total) by mouth every 4 (four) hours as needed for severe pain. 01/10/2024: d/c by provider at [facility] Dr. Geerson   No facility-administered medications prior to visit.    Review of Systems  Last metabolic panel Lab Results  Component Value Date   GLUCOSE 89 08/01/2023   NA 139 08/01/2023   K 4.1 08/01/2023   CL 99 08/01/2023   CO2 21 08/01/2023   BUN 18 08/01/2023   CREATININE 0.90 08/01/2023   EGFR 77 08/01/2023   CALCIUM 10.4 (H) 08/01/2023   PROT 7.1 08/01/2023   ALBUMIN 4.8 08/01/2023   LABGLOB 2.3 08/01/2023   AGRATIO 2.0 08/23/2022   BILITOT 0.6 08/01/2023   ALKPHOS 89 08/01/2023   AST 14 08/01/2023   ALT 12 08/01/2023   ANIONGAP 10 12/04/2022   Last lipids Lab Results  Component Value Date   CHOL 229 (H) 01/12/2023   HDL 57 01/12/2023   LDLCALC 160 (H) 01/12/2023   TRIG 70 01/12/2023   CHOLHDL 4.0 01/12/2023   Last hemoglobin A1c Lab Results  Component Value Date   HGBA1C 5.6 01/12/2023   Last thyroid  functions Lab Results  Component Value Date   TSH 0.398 (L) 01/12/2023   Last vitamin D No results found for: 25OHVITD2, 25OHVITD3, VD25OH Last vitamin B12 and Folate No results found for: VITAMINB12, FOLATE     Objective    BP 133/77 (BP Location: Right Arm, Patient Position: Sitting, Cuff Size: Normal)   Pulse 76   Temp 98.6 F (37 C) (Oral)   Ht 5' 3.5 (1.613 m)   Wt 232 lb 14.4 oz (105.6 kg)   LMP 12/29/2022 (Exact Date)   SpO2 100%   BMI 40.61 kg/m  BP Readings from Last 3 Encounters:  01/10/24 133/77  08/01/23 99/70  01/19/23 130/78   Wt Readings from Last 3 Encounters:  01/10/24 232 lb 14.4 oz (105.6 kg)  08/01/23 236 lb (107 kg)  03/06/23 256 lb (116.1 kg)        Physical Exam Vitals reviewed.  Constitutional:      General: She is not in acute distress.    Appearance: Normal appearance. She is not ill-appearing, toxic-appearing or diaphoretic.  HENT:  Head: Normocephalic  and atraumatic.     Right Ear: Tympanic membrane and external ear normal. There is no impacted cerumen.     Left Ear: Tympanic membrane and external ear normal. There is no impacted cerumen.     Nose: Nose normal.     Mouth/Throat:     Pharynx: Oropharynx is clear.  Eyes:     General: No scleral icterus.    Extraocular Movements: Extraocular movements intact.     Conjunctiva/sclera: Conjunctivae normal.     Pupils: Pupils are equal, round, and reactive to light.  Cardiovascular:     Rate and Rhythm: Normal rate and regular rhythm.     Pulses: Normal pulses.     Heart sounds: Normal heart sounds. No murmur heard.    No friction rub. No gallop.  Pulmonary:     Effort: Pulmonary effort is normal. No respiratory distress.     Breath sounds: Normal breath sounds. No wheezing, rhonchi or rales.  Abdominal:     General: Bowel sounds are normal. There is no distension.     Palpations: Abdomen is soft. There is no mass.     Tenderness: There is no abdominal tenderness. There is no guarding.  Musculoskeletal:        General: No deformity.     Cervical back: Normal range of motion and neck supple.     Right lower leg: Edema present.     Left lower leg: Edema present.  Lymphadenopathy:     Cervical: No cervical adenopathy.  Skin:    General: Skin is warm.     Capillary Refill: Capillary refill takes less than 2 seconds.     Findings: No erythema or rash.  Neurological:     General: No focal deficit present.     Mental Status: She is alert and oriented to person, place, and time.     Cranial Nerves: Cranial nerves 2-12 are intact. No cranial nerve deficit or facial asymmetry.     Motor: Motor function is intact. No weakness.     Gait: Gait normal.  Psychiatric:        Mood and Affect: Mood normal.        Behavior: Behavior normal.       Last depression screening scores    01/10/2024    9:10 AM 08/01/2023    1:57 PM 11/03/2022    8:54 AM  PHQ 2/9 Scores  PHQ - 2 Score 0 0 0  PHQ-  9 Score 0      Last fall risk screening    01/10/2024    9:10 AM  Fall Risk   Falls in the past year? 0  Number falls in past yr: 0  Injury with Fall? 0  Risk for fall due to : No Fall Risks  Follow up Falls evaluation completed    Last Audit-C alcohol use screening    08/23/2022    3:23 PM  Alcohol Use Disorder Test (AUDIT)  1. How often do you have a drink containing alcohol? 0  2. How many drinks containing alcohol do you have on a typical day when you are drinking? 0  3. How often do you have six or more drinks on one occasion? 0  AUDIT-C Score 0   A score of 3 or more in women, and 4 or more in men indicates increased risk for alcohol abuse, EXCEPT if all of the points are from question 1   No results found for any visits on 01/10/24.  Assessment & Plan    Routine Health Maintenance and Physical Exam  Immunization History  Administered Date(s) Administered   Td (Adult),5 Lf Tetanus Toxid, Preservative Free 08/17/2020   Tdap 12/12/2012, 08/02/2020    Health Maintenance  Topic Date Due   COVID-19 Vaccine (1) 01/26/2024 (Originally 09/25/1975)   Zoster Vaccines- Shingrix (1 of 2) 04/10/2024 (Originally 09/26/1990)   Influenza Vaccine  07/23/2024 (Originally 11/24/2023)   Pneumococcal Vaccine: 50+ Years (1 of 1 - PCV) 01/09/2025 (Originally 09/25/2021)   Hepatitis B Vaccines 19-59 Average Risk (1 of 3 - 19+ 3-dose series) 01/09/2025 (Originally 09/26/1990)   Mammogram  01/08/2025   DTaP/Tdap/Td (4 - Td or Tdap) 08/18/2030   Colonoscopy  10/02/2032   Hepatitis C Screening  Completed   HIV Screening  Completed   HPV VACCINES  Aged Out   Meningococcal B Vaccine  Aged Out    Problem List Items Addressed This Visit     Annual physical exam - Primary   Annual physical examination with blood pressure at 133/77 mmHg, BMI at 40.6, and weight reduction from 256 lbs to 232 lbs. Last TSH was 0.398, A1c was 5.6 a year ago, LDL was 160, total cholesterol was 229, and calcium was 10.4  in April. She follows a Mediterranean diet and exercises regularly. Declines vaccines today but plans for a shingles vaccine later. Mammogram scheduled for today. - Order CMP, A1c, CBC, repeat TSH, and lipid panel - encouraged continued balanced diet and regular physical activity       Bilateral leg edema   Relevant Orders   CMP14+EGFR   Lipedema   Lymphedema of bilateral upper and lower extremities Diagnosed with lymphedema in arms and legs. Conservative treatment with hesperidin and diosmin has improved joint pain and swelling. Uses a pump daily for trunk and legs, wears compression garments, follows a Mediterranean diet, and exercises with a vibration plate and rebounder. Considering metformin  if conservative measures are insufficient, with potential side effects including diarrhea and need for kidney function monitoring. - Continue hesperidin and diosmin - Continue daily pump therapy for trunk and legs - Continue wearing compression garments - Continue Mediterranean diet - Continue exercises with vibration plate and rebounder - Consider metformin  500 mg once daily if conservative measures are insufficient - Monitor kidney function with metabolic panel - Follow up with specialist in four months      Relevant Medications   metFORMIN  (GLUCOPHAGE -XR) 500 MG 24 hr tablet   Lymphedema   Lymphedema of bilateral upper and lower extremities Diagnosed with lymphedema in arms and legs. Conservative treatment with hesperidin and diosmin has improved joint pain and swelling. Uses a pump daily for trunk and legs, wears compression garments, follows a Mediterranean diet, and exercises with a vibration plate and rebounder. Considering metformin  if conservative measures are insufficient, with potential side effects including diarrhea and need for kidney function monitoring. - Continue hesperidin and diosmin - Continue daily pump therapy for trunk and legs - Continue wearing compression garments -  Continue Mediterranean diet - Continue exercises with vibration plate and rebounder - Consider metformin  500 mg once daily if conservative measures are insufficient - Monitor kidney function with metabolic panel - Follow up with specialist in four months      Relevant Medications   metFORMIN  (GLUCOPHAGE -XR) 500 MG 24 hr tablet   Other Visit Diagnoses       Screening for deficiency anemia       Relevant Orders   CBC     Screening for diabetes mellitus  Relevant Orders   Hemoglobin A1c     Screening for lipid disorders       Relevant Orders   Lipid panel     Low TSH level       Relevant Orders   TSH + free T4       Assessment and Plan Assessment & Plan    Osteoarthritis with improved joint pain Joint pain significantly improved with hesperidin and diosmin. Previously required knee replacement but now reports minimal pain. Conservative management effective. 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 Corrective surgery in 2019 was successful. Wears glasses for vision correction. - Continue wearing glasses  Tinnitus Experiencing faint, constant tinnitus for four months. Hearing is otherwise normal. Tinnitus is challenging to treat and often managed with cognitive behavioral therapy or SSRIs. No definitive cure available. - Consider cognitive behavioral therapy or SSRIs if symptoms worsen       Return in about 2 months (around 03/11/2024) for edema .       Rockie Agent, MD  [facility] [phone] (phone) [phone] (fax)  [facility]

## 2024-01-10 NOTE — Patient Instructions (Signed)
 To keep you healthy, please keep in mind the following health maintenance items that you are due for:   There are no preventive care reminders to display for this patient.   Best Wishes,   Dr. Lang

## 2024-01-10 NOTE — Assessment & Plan Note (Signed)
 Annual physical examination with blood pressure at 133/77 mmHg, BMI at 40.6, and weight reduction from 256 lbs to 232 lbs. Last TSH was 0.398, A1c was 5.6 a year ago, LDL was 160, total cholesterol was 229, and calcium was 10.4 in April. She follows a Mediterranean diet and exercises regularly. Declines vaccines today but plans for a shingles vaccine later. Mammogram scheduled for today. - Order CMP, A1c, CBC, repeat TSH, and lipid panel - encouraged continued balanced diet and regular physical activity

## 2024-01-11 LAB — CMP14+EGFR
ALT: 12 IU/L (ref 0–32)
AST: 14 IU/L (ref 0–40)
Albumin: 4.4 g/dL (ref 3.8–4.9)
Alkaline Phosphatase: 85 IU/L (ref 49–135)
BUN/Creatinine Ratio: 16 (ref 9–23)
BUN: 12 mg/dL (ref 6–24)
Bilirubin Total: 0.7 mg/dL (ref 0.0–1.2)
CO2: 23 mmol/L (ref 20–29)
Calcium: 9.4 mg/dL (ref 8.7–10.2)
Chloride: 102 mmol/L (ref 96–106)
Creatinine, Ser: 0.75 mg/dL (ref 0.57–1.00)
Globulin, Total: 2.2 g/dL (ref 1.5–4.5)
Glucose: 82 mg/dL (ref 70–99)
Potassium: 4.5 mmol/L (ref 3.5–5.2)
Sodium: 138 mmol/L (ref 134–144)
Total Protein: 6.6 g/dL (ref 6.0–8.5)
eGFR: 96 mL/min/1.73 (ref >59)

## 2024-01-11 LAB — LIPID PANEL
Chol/HDL Ratio: 4.2 ratio (ref 0.0–4.4)
Cholesterol, Total: 225 mg/dL — ABNORMAL HIGH (ref 100–199)
HDL: 53 mg/dL (ref >39)
LDL Chol Calc (NIH): 153 mg/dL — ABNORMAL HIGH (ref 0–99)
Triglycerides: 109 mg/dL (ref 0–149)
VLDL Cholesterol Cal: 19 mg/dL (ref 5–40)

## 2024-01-11 LAB — CBC
Hematocrit: 43.1 % (ref 34.0–46.6)
Hemoglobin: 14.1 g/dL (ref 11.1–15.9)
MCH: 28.7 pg (ref 26.6–33.0)
MCHC: 32.7 g/dL (ref 31.5–35.7)
MCV: 88 fL (ref 79–97)
Platelets: 282 x10E3/uL (ref 150–450)
RBC: 4.91 x10E6/uL (ref 3.77–5.28)
RDW: 14.5 % (ref 11.7–15.4)
WBC: 6.9 x10E3/uL (ref 3.4–10.8)

## 2024-01-11 LAB — HEMOGLOBIN A1C
Est. average glucose Bld gHb Est-mCnc: 114 mg/dL
Hgb A1c MFr Bld: 5.6 % (ref 4.8–5.6)

## 2024-01-11 LAB — TSH+FREE T4
Free T4: 1.11 ng/dL (ref 0.82–1.77)
TSH: 1.27 u[IU]/mL (ref 0.450–4.500)

## 2024-01-15 ENCOUNTER — Ambulatory Visit: Payer: Self-pay | Admitting: Family Medicine

## 2024-01-18 ENCOUNTER — Ambulatory Visit: Payer: Self-pay

## 2024-01-18 ENCOUNTER — Ambulatory Visit

## 2024-01-18 VITALS — BP 123/81 | HR 79 | Resp 16 | Wt 234.3 lb

## 2024-01-18 DIAGNOSIS — J0100 Acute maxillary sinusitis, unspecified: Secondary | ICD-10-CM | POA: Diagnosis not present

## 2024-01-18 DIAGNOSIS — B3731 Acute candidiasis of vulva and vagina: Secondary | ICD-10-CM | POA: Diagnosis not present

## 2024-01-18 MED ORDER — FLUCONAZOLE 150 MG PO TABS
150.0000 mg | ORAL_TABLET | Freq: Once | ORAL | 0 refills | Status: AC
Start: 2024-01-18 — End: 2024-01-18

## 2024-01-18 MED ORDER — AMOXICILLIN-POT CLAVULANATE 875-125 MG PO TABS
1.0000 | ORAL_TABLET | Freq: Two times a day (BID) | ORAL | 0 refills | Status: AC
Start: 2024-01-18 — End: 2024-01-25

## 2024-01-18 NOTE — Assessment & Plan Note (Signed)
 Patient with 8 day hx of congestion, sinus pressure, and worsening tinnitus R>L. Ear exam normal. DDx includes bacterial sinusitis, viral illness, eustachian tube dysfunction. Less likely AOM, otitis externa. - Will treat for sinusitis with augmentin  BID x 7 days - Recommend continuing to use Flonase daily, consider starting daily antihistamine  - Will provide 1x fluconazole  to take as needed given hx of recurrent vaginal yeast infections with antibiotic use - Return precautions given

## 2024-01-18 NOTE — Telephone Encounter (Signed)
 FYI Only or Action Required?: FYI only for provider.  Patient was last seen in primary care on 01/10/2024 by Sharma Coyer, MD.  Called Nurse Triage reporting Ear Drainage.  Symptoms began yesterday.  Interventions attempted: OTC medications: sudafed and flonase.  Symptoms are: gradually worsening.  Triage Disposition: See Physician Within 24 Hours  Patient/caregiver understands and will follow disposition?: yes      Copied from CRM #[id number]. Topic: Clinical - Red Word Triage >> Jan 18, 2024  1:19 PM Charlet HERO wrote: Red Word that prompted transfer to Nurse Triage: Patient is calling stating that she is having fluid in her ear with drainage and a lot of coughing she also states that her ears are ringing. Dr Marcine Reason for Disposition  [1] Clear drainage (not from a head injury) AND [2] persists > 24 hours  Answer Assessment - Initial Assessment Questions 1. LOCATION: Which ear is involved?      Right  2. COLOR: What is the color of the discharge?      Clear  3. CONSISTENCY: How runny is the discharge? Could it be water ?      Thin/no 4. ONSET: When did you first notice the discharge?     Yesterday  5. PAIN: Is there any earache? How bad is it?  (Scale 0-10; none, mild, moderate or severe)     Mild  6. OBJECTS: Have you put anything in your ear? (e.g., Q-tip, other object)      no 7. OTHER SYMPTOMS: Do you have any other symptoms? (e.g., headache, fever, dizziness, vomiting, runny nose)     Ears ringing, phlegm, nasal congestion, ears feel full, runny nose  Protocols used: Ear - Discharge-A-AH

## 2024-01-18 NOTE — Telephone Encounter (Signed)
 Patient seen today by Dr.Carter

## 2024-01-18 NOTE — Progress Notes (Signed)
 Acute visit   Patient: Kimberly Good   DOB: 1970-08-20   53 y.o. Female  MRN: [id number] PCP: Sharma Coyer, MD   Chief Complaint  Patient presents with   Tinnitus   Nasal Congestion    And post nasal drip. No fever Patient has been taking sudafed. Using Flonase and cough drops   Ear Drainage    Right side   Subjective     Cough, congestion, ear drainage: - Started 8 days ago - Symptoms have not improved - Symptoms similar to previous sinus infection - Has been having throat scratching, tinnitus, felt liquid in R ear yesterday - Did not stick anything in the ear - has been using flonase and sudafed with minimal improvement - Some maxillary sinus tenderness - denies fevers,chills, vomiting, diarrhea  Review of systems as noted in HPI.   Objective    BP 123/81 (BP Location: Left Arm, Patient Position: Sitting, Cuff Size: Large)   Pulse 79   Resp 16   Wt 234 lb 4.8 oz (106.3 kg)   LMP 12/29/2022 (Exact Date)   SpO2 98%   BMI 40.85 kg/m  Physical Exam Constitutional:      Appearance: Normal appearance.  HENT:     Head: Normocephalic and atraumatic.     Right Ear: Tympanic membrane, ear canal and external ear normal.     Left Ear: Tympanic membrane, ear canal and external ear normal.     Nose: Congestion and rhinorrhea present.     Right Sinus: Maxillary sinus tenderness present. No frontal sinus tenderness.     Left Sinus: Maxillary sinus tenderness present. No frontal sinus tenderness.     Mouth/Throat:     Mouth: Mucous membranes are moist.     Pharynx: Posterior oropharyngeal erythema present.     Tonsils: No tonsillar exudate.  Eyes:     Pupils: Pupils are equal, round, and reactive to light.  Pulmonary:     Effort: Pulmonary effort is normal.  Skin:    General: Skin is warm.  Neurological:     General: No focal deficit present.     Mental Status: She is alert.       No results found for any visits on 01/18/24.  Assessment &  Plan     Problem List Items Addressed This Visit       Respiratory   Acute non-recurrent maxillary sinusitis - Primary   Patient with 8 day hx of congestion, sinus pressure, and worsening tinnitus R>L. Ear exam normal. DDx includes bacterial sinusitis, viral illness, eustachian tube dysfunction. Less likely AOM, otitis externa. - Will treat for sinusitis with augmentin  BID x 7 days - Recommend continuing to use Flonase daily, consider starting daily antihistamine  - Will provide 1x fluconazole  to take as needed given hx of recurrent vaginal yeast infections with antibiotic use - Return precautions given      Relevant Medications   amoxicillin -clavulanate (AUGMENTIN ) 875-125 MG tablet   fluconazole  (DIFLUCAN ) 150 MG tablet   Other Visit Diagnoses       Vaginal yeast infection       Relevant Medications   fluconazole  (DIFLUCAN ) 150 MG tablet       Meds ordered this encounter  Medications   amoxicillin -clavulanate (AUGMENTIN ) 875-125 MG tablet    Sig: Take 1 tablet by mouth 2 (two) times daily for 7 days.    Dispense:  14 tablet    Refill:  0   fluconazole  (DIFLUCAN ) 150 MG tablet  Sig: Take 1 tablet (150 mg total) by mouth once for 1 dose.    Dispense:  1 tablet    Refill:  0     No follow-ups on file.      Isaiah DELENA Pepper, MD  [facility] [phone] (phone) [phone] (fax)

## 2024-01-19 ENCOUNTER — Encounter: Payer: Self-pay | Admitting: Family Medicine

## 2024-01-19 DIAGNOSIS — I890 Lymphedema, not elsewhere classified: Secondary | ICD-10-CM

## 2024-01-19 DIAGNOSIS — R609 Edema, unspecified: Secondary | ICD-10-CM

## 2024-01-19 MED ORDER — METFORMIN HCL ER 500 MG PO TB24: 1000.0000 mg | ORAL_TABLET | Freq: Every day | ORAL | Status: AC

## 2024-02-26 ENCOUNTER — Encounter: Payer: Self-pay | Admitting: Radiology

## 2024-03-19 ENCOUNTER — Ambulatory Visit: Admitting: Family Medicine
